# Patient Record
Sex: Female | Born: 1950 | Race: White | Hispanic: No | Smoking: Former smoker
Health system: Southern US, Community
[De-identification: ages and names within clinical notes are randomized; demographics above are authoritative.]

## PROBLEM LIST (undated history)

## (undated) DIAGNOSIS — M1990 Unspecified osteoarthritis, unspecified site: Secondary | ICD-10-CM

## (undated) DIAGNOSIS — I10 Essential (primary) hypertension: Secondary | ICD-10-CM

## (undated) DIAGNOSIS — Z87442 Personal history of urinary calculi: Secondary | ICD-10-CM

## (undated) DIAGNOSIS — J45909 Unspecified asthma, uncomplicated: Secondary | ICD-10-CM

## (undated) DIAGNOSIS — E785 Hyperlipidemia, unspecified: Secondary | ICD-10-CM

## (undated) HISTORY — DX: Essential (primary) hypertension: I10

## (undated) HISTORY — DX: Unspecified asthma, uncomplicated: J45.909

## (undated) HISTORY — DX: Unspecified osteoarthritis, unspecified site: M19.90

## (undated) HISTORY — PX: EYE SURGERY: SHX253

## (undated) HISTORY — DX: Hyperlipidemia, unspecified: E78.5

---

## 1994-12-07 HISTORY — PX: OTHER SURGICAL HISTORY: SHX169

## 2008-10-04 ENCOUNTER — Emergency Department: Payer: Self-pay | Admitting: Emergency Medicine

## 2008-11-07 ENCOUNTER — Emergency Department: Payer: Self-pay | Admitting: Unknown Physician Specialty

## 2009-07-06 ENCOUNTER — Inpatient Hospital Stay: Payer: Self-pay | Admitting: Internal Medicine

## 2009-07-23 ENCOUNTER — Emergency Department: Payer: Self-pay | Admitting: Emergency Medicine

## 2011-05-02 ENCOUNTER — Emergency Department: Payer: Self-pay | Admitting: Emergency Medicine

## 2011-09-23 ENCOUNTER — Ambulatory Visit: Payer: Self-pay | Admitting: Gastroenterology

## 2011-10-21 ENCOUNTER — Ambulatory Visit: Payer: Self-pay | Admitting: Internal Medicine

## 2012-12-07 HISTORY — PX: COLONOSCOPY: SHX174

## 2013-05-16 ENCOUNTER — Ambulatory Visit: Payer: Self-pay | Admitting: Specialist

## 2013-05-16 LAB — URINALYSIS, COMPLETE
Bacteria: NONE SEEN
Blood: NEGATIVE
Glucose,UR: NEGATIVE mg/dL (ref 0–75)
Ketone: NEGATIVE
Nitrite: NEGATIVE
Ph: 5 (ref 4.5–8.0)
Protein: NEGATIVE
Specific Gravity: 1.029 (ref 1.003–1.030)
WBC UR: 2 /HPF (ref 0–5)

## 2013-05-16 LAB — CBC
HCT: 36.7 % (ref 35.0–47.0)
HGB: 12.6 g/dL (ref 12.0–16.0)
MCH: 30.2 pg (ref 26.0–34.0)
RBC: 4.18 10*6/uL (ref 3.80–5.20)
RDW: 12.8 % (ref 11.5–14.5)

## 2013-05-16 LAB — APTT: Activated PTT: 30.5 secs (ref 23.6–35.9)

## 2013-05-23 ENCOUNTER — Inpatient Hospital Stay: Payer: Self-pay | Admitting: Specialist

## 2013-05-23 HISTORY — PX: JOINT REPLACEMENT: SHX530

## 2013-05-23 LAB — HEMOGLOBIN: HGB: 9.6 g/dL — ABNORMAL LOW (ref 12.0–16.0)

## 2013-05-24 LAB — BASIC METABOLIC PANEL
Anion Gap: 6 — ABNORMAL LOW (ref 7–16)
Calcium, Total: 8.2 mg/dL — ABNORMAL LOW (ref 8.5–10.1)
Chloride: 108 mmol/L — ABNORMAL HIGH (ref 98–107)
Creatinine: 0.85 mg/dL (ref 0.60–1.30)
EGFR (African American): >60
Osmolality: 278 (ref 275–301)
Potassium: 4.2 mmol/L (ref 3.5–5.1)
Sodium: 138 mmol/L (ref 136–145)

## 2013-05-24 LAB — HEMOGLOBIN: HGB: 9.3 g/dL — ABNORMAL LOW (ref 12.0–16.0)

## 2013-10-20 ENCOUNTER — Ambulatory Visit: Payer: Self-pay | Admitting: Nurse Practitioner

## 2013-11-20 ENCOUNTER — Encounter: Payer: Self-pay | Admitting: General Surgery

## 2013-11-20 ENCOUNTER — Ambulatory Visit (INDEPENDENT_AMBULATORY_CARE_PROVIDER_SITE_OTHER): Payer: BC Managed Care – PPO | Admitting: General Surgery

## 2013-11-20 VITALS — BP 138/80 | HR 80 | Resp 14 | Ht 62.0 in | Wt 183.0 lb

## 2013-11-20 DIAGNOSIS — R071 Chest pain on breathing: Secondary | ICD-10-CM

## 2013-11-20 DIAGNOSIS — R0789 Other chest pain: Secondary | ICD-10-CM

## 2013-11-20 NOTE — Patient Instructions (Signed)
Patient to return as needed. 

## 2013-11-20 NOTE — Progress Notes (Signed)
Patient ID: Elaine Harmon, female   DOB: 1951/06/10, 62 y.o.   MRN: [id number]  Chief Complaint  Patient presents with  . Other    lump    HPI Elaine Harmon is a 62 y.o. female who presents for a breast evaluation. The most recent mammogram was done on 10/20/13.Patient does perform regular self breast checks and gets regular mammograms done. Patient felt a lump in the middle of her breast bone. She noticed this 4 weeks ago.She states it there is no changed in size. She states she found it because the area was tender. Patient Elaine Harmon any breast problems in the past.Patient states she lost 20 pounds at the beginning of the year.She remembers she was picking up something at her job that was heavy about 5 weeks ago. This involved reaching through a narrow corridor and pulling the object towards her. 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 In the last couple of weeks the areas become less tender and slightly less prominent. No direct trauma to the area.  HPI  Past Medical History  Diagnosis Date  . Hypertension   . Hyperlipidemia   . Arthritis   . Diabetes mellitus without complication   . Asthma     Past Surgical History  Procedure Laterality Date  . Joint replacement  05/23/13    hip replacement  . Kidney tube resection  1996  . Colonoscopy  2014    Family History  Problem Relation Age of Onset  . Breast cancer Maternal Grandmother     Social History History  Substance Use Topics  . Smoking status: Former Smoker    Types: Cigarettes  . Smokeless tobacco: Never Used  . Alcohol Use: No    Allergies  Allergen Reactions  . Sulfa Antibiotics Hives    Current Outpatient Prescriptions  Medication Sig Dispense Refill  . etodolac (LODINE) 500 MG tablet Take 500 mg by mouth 2 (two) times daily.      Marland Kitchen lisinopril (PRINIVIL,ZESTRIL) 5 MG tablet Take 5 mg by mouth daily.      . metFORMIN (GLUCOPHAGE) 500 MG tablet Take 250 mg by mouth 2 (two) times daily with a meal.      .  simvastatin (ZOCOR) 10 MG tablet Take 10 mg by mouth daily.       No current facility-administered medications for this visit.    Review of Systems Review of Systems  Constitutional: Negative.   Respiratory: Negative.   Cardiovascular: Negative.     Blood pressure 138/80, pulse 80, resp. rate 14, height 5\' 2"  (1.575 m), weight 183 lb (83.008 kg).  Physical Exam Physical Exam  Constitutional: She is oriented to person, place, and time. She appears well-developed and well-nourished.  Eyes: No scleral icterus.  Cardiovascular: Normal rate, regular rhythm and normal heart sounds.   Pulmonary/Chest: Breath sounds normal. Right breast exhibits no inverted nipple, no mass, no nipple discharge, no skin change and no tenderness. Left breast exhibits no inverted nipple, no mass, no nipple discharge, no skin change and no tenderness. Breast asymmetry: right breast half cup size bigger than left.    Lymphadenopathy:    She has no cervical adenopathy.    She has no axillary adenopathy.  Neurological: She is alert and oriented to person, place, and time.  Skin: Skin is warm and dry.    Data Reviewed Bilateral mammograms dated 10/20/2013 were reviewed. BI-RAD-1.  Assessment    Musculoskeletal trauma secondary to lifting, resolving    Plan    No intervention  is required at this time. She was encouraged to call if new concerns arise.       [name] 11/21/2013, 7:08 AM

## 2013-11-21 DIAGNOSIS — R0789 Other chest pain: Secondary | ICD-10-CM | POA: Insufficient documentation

## 2014-10-08 ENCOUNTER — Encounter: Payer: Self-pay | Admitting: General Surgery

## 2015-03-29 NOTE — Op Note (Signed)
PATIENT NAME:  Elaine GarinCALES, Bertrice E MR#:  [id number] DATE OF BIRTH:  [birth year]  DATE OF PROCEDURE:  05/23/2013  PREOPERATIVE DIAGNOSIS: Severe degenerative arthritis, right hip.   POSTOPERATIVE DIAGNOSIS: Severe degenerative arthritis, right hip.   PROCEDURE: Right total hip arthroplasty.   SURGEON: Myra Rudehristopher Alicianna Litchford, M.D.   ASSISTANT: Deeann SaintHoward Miller, M.D.   ANESTHESIA: Spinal.   COMPLICATIONS: None.   ESTIMATED BLOOD LOSS: 300 mL.   DRAINS: Two Autovac.   DESCRIPTION OF PROCEDURE: 2 grams of Ancef was given intravenously prior to the procedure. Spinal anesthesia is induced. The patient is placed in the left lateral decubitus position for right hip surgery. The right hip is thoroughly prepped with alcohol and ChloraPrep and draped in standard sterile fashion. A standard posterolateral incision is made and the dissection carried down to the fascia lata which is excised in the line of the skin incision. Charnley retractor is placed. The sciatic nerve is palpated deep within the wound and protected throughout the case. Piriformis tendon is identified and cut off of the femur and tagged. The remainder of the external rotator muscles are cut with the coagulation Bovie. Periosteal elevator is used to completely define the posterior capsule. This is incised in a T fashion and the ends are tagged and saved. The hip is then easily dislocated and the femoral head cut off at the appropriate neck angle and length. The retractors are placed and labrum debridement and any other soft tissue within the acetabulum is removed. The acetabulum is then are reamed up to a 51 mm in diameter reamer and the 52 porous-coated trispiked acetabulum is impacted into place and is seen to be a secure fit. The hole eliminator and then the polyethylene liner with the 4 mm build-up is placed superiorly and somewhat posteriorly. Proximal femur is then reamed in standard fashion up to a 13 mm in diameter and the 13.5 broach, narrow  component, is used to broach the proximal femur.  A -2 hip ball was then used and the hip is reduced and is seen to be stable in all directions. Femoral trial components are then removed. The wound is thoroughly irrigated multiple times. The porous-coated AML 13.5 narrow prosthesis is then impacted into place and is seen to be an excellent fit. The -2 mm head is impacted into place and the hip is reduced and is seen to be satisfactory on range of motion. The wound is thoroughly irrigated multiple times. Two Autovac drains were brought out through separate stab wound incisions. Fascia lata is closed with 0 Ethibond. Subcutaneous tissue is closed with 2-0 Vicryl and the skin is closed with the skin stapler. A soft bulky dressing is applied. The patient is then carefully turned over into the hospital bed and secured with the abduction pillow. She is returned to the recovery room in satisfactory condition having tolerated the procedure quite well.    ____________________________ Clare Gandyhristopher E. Annesha Delgreco, MD ces:rw D: 05/23/2013 11:32:51 ET T: 05/23/2013 12:23:07 ET JOB#: [id number]  cc: Clare Gandyhristopher E. Cylas Falzone, MD, <Dictator> Clare GandyHRISTOPHER E Vonette Grosso MD ELECTRONICALLY SIGNED 05/24/2013 6:15

## 2015-03-29 NOTE — Discharge Summary (Signed)
PATIENT NAME:  Elaine Harmon, Elaine Harmon MR#:  [id number] DATE OF BIRTH:  May 11, 1951  DATE OF ADMISSION:  05/23/2013 DATE OF DISCHARGE:  05/26/2013  DISCHARGE DIAGNOSES 1.  Severe degenerative arthritis right hip.  2.  Hypertension.  3.  Glaucoma.  4.  History of kidney stones.  5.  Hyperlipidemia.  6.  Asthma.  7.  Type 2 diabetes mellitus.   OPERATIONS OR PROCEDURES PERFORMED: On 05/23/2013 right total hip arthroplasty with DePuy AML components.   HISTORY AND PHYSICAL EXAMINATION: As written on admission.   LABORATORY DATA: As noted in the chart.   COURSE IN THE HOSPITAL: Preoperative medical clearance had been obtained prior to surgery. The patient was taken to the Operating Room on 05/23/2013, where right total hip replacement was performed without any particular problems. Postoperatively, the patient did extremely well. Her Hemovac was removed on day #1.  Her hemoglobin was seen to be satisfactory postoperatively. She was progressed in physical therapy with partial weight-bearing. Her wound was seen to be healing in well.   DISCHARGE INSTRUCTIONS:  She was discharged on 05/26/2013 on her home medications and, in addition, aspirin 325 mg twice a day after meals and oxycodone 10 mg every 3 hours as necessary for pain. She is to have home health physical therapy which was ordered. She is to return to the office to see Dr. Katrinka BlazingSmith in approximately 2 weeks for x-ray and probable staple removal.   ____________________________ Clare Gandyhristopher Harmon. Aemon Koeller, MD ces:cs D: 06/13/2013 14:19:20 ET T: 06/13/2013 14:35:26 ET JOB#: [id number]  cc: Clare Gandyhristopher Harmon. Kennen Stammer, MD, <Dictator> Clare GandyHRISTOPHER Harmon Sherese Heyward MD ELECTRONICALLY SIGNED 06/13/2013 19:31

## 2016-08-12 ENCOUNTER — Other Ambulatory Visit: Payer: Self-pay | Admitting: Nurse Practitioner

## 2016-08-12 DIAGNOSIS — Z1231 Encounter for screening mammogram for malignant neoplasm of breast: Secondary | ICD-10-CM

## 2016-09-08 ENCOUNTER — Ambulatory Visit: Payer: Self-pay | Attending: Nurse Practitioner

## 2017-02-05 ENCOUNTER — Other Ambulatory Visit: Payer: Self-pay | Admitting: Physician Assistant

## 2017-02-05 DIAGNOSIS — M25519 Pain in unspecified shoulder: Secondary | ICD-10-CM

## 2017-02-17 ENCOUNTER — Other Ambulatory Visit: Payer: Self-pay | Admitting: Physician Assistant

## 2017-02-17 DIAGNOSIS — M25519 Pain in unspecified shoulder: Secondary | ICD-10-CM

## 2017-03-01 ENCOUNTER — Ambulatory Visit
Admission: RE | Admit: 2017-03-01 | Discharge: 2017-03-01 | Disposition: A | Discharge status: Home / Self Care | Payer: BLUE CROSS/BLUE SHIELD | Source: Ambulatory Visit | Attending: Physician Assistant | Admitting: Physician Assistant

## 2017-03-01 DIAGNOSIS — M62512 Muscle wasting and atrophy, not elsewhere classified, left shoulder: Secondary | ICD-10-CM | POA: Diagnosis not present

## 2017-03-01 DIAGNOSIS — M25712 Osteophyte, left shoulder: Secondary | ICD-10-CM | POA: Diagnosis not present

## 2017-03-01 DIAGNOSIS — M19012 Primary osteoarthritis, left shoulder: Secondary | ICD-10-CM | POA: Diagnosis not present

## 2017-03-01 DIAGNOSIS — M24012 Loose body in left shoulder: Secondary | ICD-10-CM | POA: Insufficient documentation

## 2017-03-01 DIAGNOSIS — M25512 Pain in left shoulder: Secondary | ICD-10-CM | POA: Diagnosis present

## 2017-03-01 DIAGNOSIS — M25519 Pain in unspecified shoulder: Secondary | ICD-10-CM

## 2017-04-19 ENCOUNTER — Encounter: Payer: Self-pay | Admitting: *Deleted

## 2017-04-22 ENCOUNTER — Ambulatory Visit: Payer: BLUE CROSS/BLUE SHIELD | Admitting: Anesthesiology

## 2017-04-22 ENCOUNTER — Encounter
Admission: RE | Disposition: A | Discharge status: Home / Self Care | Payer: Self-pay | Source: Ambulatory Visit | Attending: Ophthalmology

## 2017-04-22 ENCOUNTER — Encounter: Payer: Self-pay | Admitting: *Deleted

## 2017-04-22 ENCOUNTER — Ambulatory Visit
Admission: RE | Admit: 2017-04-22 | Discharge: 2017-04-22 | Disposition: A | Discharge status: Home / Self Care | Payer: BLUE CROSS/BLUE SHIELD | Source: Ambulatory Visit | Attending: Ophthalmology | Admitting: Ophthalmology

## 2017-04-22 DIAGNOSIS — H401112 Primary open-angle glaucoma, right eye, moderate stage: Secondary | ICD-10-CM | POA: Diagnosis not present

## 2017-04-22 DIAGNOSIS — I10 Essential (primary) hypertension: Secondary | ICD-10-CM | POA: Insufficient documentation

## 2017-04-22 DIAGNOSIS — Z87891 Personal history of nicotine dependence: Secondary | ICD-10-CM | POA: Insufficient documentation

## 2017-04-22 DIAGNOSIS — E1136 Type 2 diabetes mellitus with diabetic cataract: Secondary | ICD-10-CM | POA: Diagnosis not present

## 2017-04-22 DIAGNOSIS — Z79899 Other long term (current) drug therapy: Secondary | ICD-10-CM | POA: Diagnosis not present

## 2017-04-22 DIAGNOSIS — J45909 Unspecified asthma, uncomplicated: Secondary | ICD-10-CM | POA: Insufficient documentation

## 2017-04-22 HISTORY — PX: CATARACT EXTRACTION W/PHACO: SHX586

## 2017-04-22 HISTORY — DX: Personal history of urinary calculi: Z87.442

## 2017-04-22 LAB — GLUCOSE, CAPILLARY: GLUCOSE-CAPILLARY: 111 mg/dL — AB (ref 65–99)

## 2017-04-22 SURGERY — PHACOEMULSIFICATION, CATARACT, WITH IOL INSERTION
Anesthesia: Monitor Anesthesia Care | Site: Eye | Laterality: Right | Wound class: Clean

## 2017-04-22 MED ORDER — SODIUM CHLORIDE 0.9 % IV SOLN
INTRAVENOUS | Status: DC
  Administered 2017-04-22: 08:00:00 via INTRAVENOUS

## 2017-04-22 MED ORDER — MOXIFLOXACIN HCL 0.5 % OP SOLN: 1.0000 [drp] | OPHTHALMIC | Status: DC | PRN

## 2017-04-22 MED ORDER — MOXIFLOXACIN HCL 0.5 % OP SOLN
OPHTHALMIC | Status: AC
  Filled 2017-04-22: qty 3

## 2017-04-22 MED ORDER — SODIUM HYALURONATE 23 MG/ML IO SOLN
INTRAOCULAR | Status: AC
  Filled 2017-04-22: qty 0.6

## 2017-04-22 MED ORDER — MIDAZOLAM HCL 2 MG/2ML IJ SOLN
INTRAMUSCULAR | Status: AC
  Filled 2017-04-22: qty 2

## 2017-04-22 MED ORDER — POVIDONE-IODINE 5 % OP SOLN
OPHTHALMIC | Status: DC | PRN
  Administered 2017-04-22: 1 via OPHTHALMIC

## 2017-04-22 MED ORDER — CARBACHOL 0.01 % IO SOLN
INTRAOCULAR | Status: DC | PRN
  Administered 2017-04-22: 0.5 mL via INTRAOCULAR

## 2017-04-22 MED ORDER — LIDOCAINE HCL (PF) 2 % IJ SOLN
INTRAMUSCULAR | Status: AC
  Filled 2017-04-22: qty 2

## 2017-04-22 MED ORDER — HYALURONIDASE HUMAN 150 UNIT/ML IJ SOLN
INTRAMUSCULAR | Status: AC
  Filled 2017-04-22: qty 1

## 2017-04-22 MED ORDER — ONDANSETRON HCL 4 MG/2ML IJ SOLN
INTRAMUSCULAR | Status: DC | PRN
  Administered 2017-04-22: 4 mg via INTRAVENOUS

## 2017-04-22 MED ORDER — MIDAZOLAM HCL 2 MG/2ML IJ SOLN
INTRAMUSCULAR | Status: DC | PRN
  Administered 2017-04-22: 1 mg via INTRAVENOUS

## 2017-04-22 MED ORDER — EPINEPHRINE PF 1 MG/ML IJ SOLN
INTRAMUSCULAR | Status: AC
Start: 2017-04-22 — End: ?
  Filled 2017-04-22: qty 1

## 2017-04-22 MED ORDER — POVIDONE-IODINE 5 % OP SOLN
OPHTHALMIC | Status: AC
  Filled 2017-04-22: qty 30

## 2017-04-22 MED ORDER — NEOMYCIN-POLYMYXIN-DEXAMETH 3.5-10000-0.1 OP OINT
TOPICAL_OINTMENT | OPHTHALMIC | Status: AC
  Filled 2017-04-22: qty 3.5

## 2017-04-22 MED ORDER — EPINEPHRINE PF 1 MG/ML IJ SOLN
INTRAMUSCULAR | Status: AC
  Filled 2017-04-22: qty 2

## 2017-04-22 MED ORDER — NEOMYCIN-POLYMYXIN-DEXAMETH 3.5-10000-0.1 OP OINT
TOPICAL_OINTMENT | OPHTHALMIC | Status: DC | PRN
  Administered 2017-04-22: 1 via OPHTHALMIC

## 2017-04-22 MED ORDER — ALFENTANIL 500 MCG/ML IJ INJ
INJECTION | INTRAVENOUS | Status: DC | PRN
  Administered 2017-04-22: 500 ug via INTRAVENOUS
  Administered 2017-04-22: 250 ug via INTRAVENOUS

## 2017-04-22 MED ORDER — SODIUM HYALURONATE 10 MG/ML IO SOLN
INTRAOCULAR | Status: DC | PRN
  Administered 2017-04-22: 0.55 mL via INTRAOCULAR

## 2017-04-22 MED ORDER — MOXIFLOXACIN HCL 0.5 % OP SOLN
OPHTHALMIC | Status: DC | PRN
  Administered 2017-04-22: 0.2 mL via OPHTHALMIC

## 2017-04-22 MED ORDER — BUPIVACAINE HCL (PF) 0.75 % IJ SOLN
INTRAMUSCULAR | Status: AC
  Filled 2017-04-22: qty 10

## 2017-04-22 MED ORDER — FENTANYL CITRATE (PF) 100 MCG/2ML IJ SOLN
INTRAMUSCULAR | Status: AC
  Filled 2017-04-22: qty 2

## 2017-04-22 MED ORDER — ARMC OPHTHALMIC DILATING DROPS
1.0000 "application " | OPHTHALMIC | Status: DC
  Administered 2017-04-22 (×3): 1 via OPHTHALMIC

## 2017-04-22 MED ORDER — LIDOCAINE HCL (PF) 4 % IJ SOLN
INTRAMUSCULAR | Status: DC | PRN
  Administered 2017-04-22: 4 mL via OPHTHALMIC

## 2017-04-22 MED ORDER — LIDOCAINE HCL (PF) 4 % IJ SOLN
INTRAMUSCULAR | Status: DC | PRN
  Administered 2017-04-22: 9 mL via OPHTHALMIC

## 2017-04-22 MED ORDER — SODIUM HYALURONATE 10 MG/ML IO SOLN
INTRAOCULAR | Status: AC
  Filled 2017-04-22: qty 0.85

## 2017-04-22 MED ORDER — SODIUM HYALURONATE 23 MG/ML IO SOLN
INTRAOCULAR | Status: DC | PRN
  Administered 2017-04-22: 0.6 mL via INTRAOCULAR

## 2017-04-22 MED ORDER — ARMC OPHTHALMIC DILATING DROPS
OPHTHALMIC | Status: AC
  Administered 2017-04-22: 1 via OPHTHALMIC
  Filled 2017-04-22: qty 0.4

## 2017-04-22 MED ORDER — ONDANSETRON HCL 4 MG/2ML IJ SOLN
INTRAMUSCULAR | Status: AC
Start: 2017-04-22 — End: ?
  Filled 2017-04-22: qty 2

## 2017-04-22 MED ORDER — EPINEPHRINE PF 1 MG/ML IJ SOLN
INTRAOCULAR | Status: DC | PRN
  Administered 2017-04-22: 09:00:00 via OPHTHALMIC

## 2017-04-22 SURGICAL SUPPLY — 17 items
DISSECTOR HYDRO NUCLEUS 50X22 (MISCELLANEOUS) ×3 IMPLANT
GLOVE BIO SURGEON STRL SZ8 (GLOVE) ×3 IMPLANT
GLOVE BIOGEL M 6.5 STRL (GLOVE) ×3 IMPLANT
GLOVE SURG LX 7.5 STRW (GLOVE) ×2
GLOVE SURG LX STRL 7.5 STRW (GLOVE) ×1 IMPLANT
GOWN STRL REUS W/ TWL LRG LVL3 (GOWN DISPOSABLE) ×2 IMPLANT
GOWN STRL REUS W/TWL LRG LVL3 (GOWN DISPOSABLE) ×4
ISTENT OPTHAL 1X.33 ADULT LF (Permanent Stent) ×3 IMPLANT
LENS IOL TECNIS ITEC 17.0 (Intraocular Lens) ×3 IMPLANT
PACK CATARACT (MISCELLANEOUS) ×3 IMPLANT
PACK CATARACT KING (MISCELLANEOUS) ×2 IMPLANT
PACK EYE AFTER SURG (MISCELLANEOUS) ×3 IMPLANT
SOL BSS BAG (MISCELLANEOUS) ×3
SOLUTION BSS BAG (MISCELLANEOUS) ×1 IMPLANT
STENT 'ISTENT OPTH 1X.33 ADLT (Permanent Stent) ×1 IMPLANT
WATER STERILE IRR 250ML POUR (IV SOLUTION) ×3 IMPLANT
WIPE NON LINTING 3.25X3.25 (MISCELLANEOUS) ×3 IMPLANT

## 2017-04-22 NOTE — Discharge Instructions (Signed)
Eye Surgery Discharge Instructions  Expect mild scratchy sensation or mild soreness. DO NOT RUB YOUR EYE!  The day of surgery:  Minimal physical activity, but bed rest is not required  No reading, computer work, or close hand work  No bending, lifting, or straining.  May watch TV  For 24 hours:  No driving, legal decisions, or alcoholic beverages  Safety precautions  Eat anything you prefer: It is better to start with liquids, then soup then solid foods.  _____ Eye patch should be worn until postoperative exam tomorrow.  ____ Solar shield eyeglasses should be worn for comfort in the sunlight/patch while sleeping  Resume all regular medications including aspirin or Coumadin if these were discontinued prior to surgery. You may shower, bathe, shave, or wash your hair. Tylenol may be taken for mild discomfort.  Call your doctor if you experience significant pain, nausea, or vomiting, fever > 101 or other signs of infection. [phone] or [phone] Specific instructions:  Follow-up Information    [place] CraneKing, Bradley Mark, MD Follow up.   Specialty:  Ophthalmology Why:  04-23-17 at9:30 Contact information: [address] [place] [address] [phone]          Eye Surgery Discharge Instructions  Expect mild scratchy sensation or mild soreness. DO NOT RUB YOUR EYE!  The day of surgery:  Minimal physical activity, but bed rest is not required  No reading, computer work, or close hand work  No bending, lifting, or straining.  May watch TV  For 24 hours:  No driving, legal decisions, or alcoholic beverages  Safety precautions  Eat anything you prefer: It is better to start with liquids, then soup then solid foods.  _____ Eye patch should be worn until postoperative exam tomorrow.  ____ Solar shield eyeglasses should be worn for comfort in the sunlight/patch while sleeping  Resume all regular medications including aspirin or Coumadin if these were  discontinued prior to surgery. You may shower, bathe, shave, or wash your hair. Tylenol may be taken for mild discomfort.  Call your doctor if you experience significant pain, nausea, or vomiting, fever > 101 or other signs of infection. [phone] or [phone] Specific instructions:  Follow-up Information    [place] CraneKing, Bradley Mark, MD Follow up.   Specialty:  Ophthalmology Why:  04-23-17 at9:30 Contact information: [address] [place] [address] [phone]

## 2017-04-22 NOTE — Transfer of Care (Signed)
Immediate Anesthesia Transfer of Care Note  Patient: Elaine Harmon  Procedure(s) Performed: Procedure(s) with comments: CATARACT EXTRACTION PHACO AND INTRAOCULAR LENS PLACEMENT (IOC) INSERTION OF I-STENT (Right) - [place] 1:35.5 AP% 17.2 CDE 17.29 FLUID PACK LOT# [id number]  Patient Location: PACU  Anesthesia Type:MAC  Level of Consciousness: awake  Airway & Oxygen Therapy: Patient Spontanous Breathing and Patient connected to nasal cannula oxygen  Post-op Assessment: Report given to RN and Post -op Vital signs reviewed and stable  Post vital signs: Reviewed and stable  Last Vitals:  Vitals:   04/22/17 0739  BP: 130/68  Pulse: 71  Resp: 16  Temp: 36.8 C    Last Pain:  Vitals:   04/22/17 0739  TempSrc: Oral         Complications: No apparent anesthesia complications

## 2017-04-22 NOTE — H&P (Signed)
The History and Physical notes are on paper, have been signed, and are to be scanned.   I have examined the patient and there are no changes to the H&P.   Willey BladeBradley King 04/22/2017 8:43 AM

## 2017-04-22 NOTE — Anesthesia Preprocedure Evaluation (Signed)
Anesthesia Evaluation  Patient identified by MRN, date of birth, ID band Patient awake    Reviewed: Allergy & Precautions, NPO status , Patient's Chart, lab work & pertinent test results  Airway Mallampati: III       Dental   Pulmonary asthma , former smoker,           Cardiovascular hypertension, Pt. on medications      Neuro/Psych negative neurological ROS     GI/Hepatic negative GI ROS, Neg liver ROS,   Endo/Other  diabetes, Type 2  Renal/GU      Musculoskeletal   Abdominal   Peds  Hematology negative hematology ROS (+)   Anesthesia Other Findings   Reproductive/Obstetrics                             Anesthesia Physical Anesthesia Plan  ASA: II  Anesthesia Plan: MAC   Post-op Pain Management:    Induction: Intravenous  Airway Management Planned: Nasal Cannula  Additional Equipment:   Intra-op Plan:   Post-operative Plan:   Informed Consent: I have reviewed the patients History and Physical, chart, labs and discussed the procedure including the risks, benefits and alternatives for the proposed anesthesia with the patient or authorized representative who has indicated his/her understanding and acceptance.     Plan Discussed with:   Anesthesia Plan Comments:         Anesthesia Quick Evaluation

## 2017-04-22 NOTE — Anesthesia Post-op Follow-up Note (Cosign Needed)
Anesthesia QCDR form completed.        

## 2017-04-22 NOTE — Op Note (Signed)
OPERATIVE NOTE  Elaine Harmon [id number] 04/22/2017   PREOPERATIVE DIAGNOSIS:   1.  Moderate open angle glaucoma, right eye. F81.8299 2.  Nuclear sclerotic cataract right eye.  H25.11   POSTOPERATIVE DIAGNOSIS:    same.   PROCEDURE:   1.  Placement of trabecular bypass stent (istent). CPT 0191T 2.  Phacoemusification with posterior chamber intraocular lens placement of the right eye  CPT [phone]   LENS:   Implant Name Type Inv. Item Serial No. Manufacturer Lot No. LRB No. Used  LENS IOL DIOP 17.0 - [id number] Intraocular Lens LENS IOL DIOP 17.0 [id number] AMO   Right 1   Other implanted device:  [employer] Istent model [id number] SN #[id number]      PCB00 +17.0   ULTRASOUND TIME: 1 minutes 35 seconds.  CDE 17.29   SURGEON:  Benay Pillow, MD, MPH  ANESTHESIOLOGIST: Anesthesiologist: Gunnar Fusi, MD CRNA: Nelda Marseille, CRNA; Allean Found, CRNA   ANESTHESIA:  MAC with retrobulbar block and intracameral preservative-free intracameral lidocaine.  ESTIMATED BLOOD LOSS: less than 1 mL.   COMPLICATIONS:  None.   DESCRIPTION OF PROCEDURE:  The patient was identified in the holding room and transported to the operating room. A retrobulbar block was performed with 3 cc.  and placed in the supine position under the operating microscope.  The right eye was identified as the operative eye and it was prepped and draped in the usual sterile ophthalmic fashion.   A 1.0 millimeter clear-corneal paracentesis was made at the 10:30 position. 0.5 ml of preservative-free 1% lidocaine with epinephrine was injected into the anterior chamber.  The anterior chamber was filled with Healon 5 viscoelastic.  A 2.4 millimeter keratome was used to make a near-clear corneal incision at the 8:00 position.   Attention was turned to the istent.  The patients head was turned to the left and the microscope was tilted to 035 degrees. Ocular instruments/Glaukos OAL/H2 gonioprism was used with IPC05  (iclip) coupled with Healon 5 on the cornea was used to visualize the trabecular meshwork. The istent was opened and introduced into the eye.  The meshwork was engaged with the tip of the iStent and then using the inserter, the iStent slid down Schlemm's canal until the stent was well seated.  The stent was then released from the inserter.  The iStent was inspected and in good position.  Next, attention was turned to the phacoemulsification A curvilinear capsulorrhexis was made with a cystotome and capsulorrhexis forceps.  Balanced salt solution was used to hydrodissect and hydrodelineate the nucleus.   Phacoemulsification was then used in stop and chop fashion to remove the lens nucleus and epinucleus. There was moderate chemosis which occasionally interefered with the superior view.  The remaining cortex was then removed using the irrigation and aspiration handpiece. Healon was then placed into the capsular bag to distend it for lens placement.  A lens was then injected into the capsular bag.  The remaining viscoelastic was aspirated.   Wounds were hydrated with balanced salt solution.  The anterior chamber was inflated to a physiologic pressure with balanced salt solution.    Intracameral vigamox 0.1 mL undiluted was injected into the eye and a drop placed onto the ocular surface.  No wound leaks were noted.  Maxitrol ointment, a patch and shield were placed.  The patient was taken to the recovery room in stable condition without complications of anesthesia or surgery  Benay Pillow 04/22/2017, 9:55 AM

## 2017-04-22 NOTE — Progress Notes (Signed)
Shield intact and Dr. Brooke DareKing into see

## 2017-04-22 NOTE — Anesthesia Postprocedure Evaluation (Signed)
Anesthesia Post Note  Patient: Elaine Harmon  Procedure(s) Performed: Procedure(s) (LRB): CATARACT EXTRACTION PHACO AND INTRAOCULAR LENS PLACEMENT (IOC) INSERTION OF I-STENT (Right)  Patient location during evaluation: Short Stay Anesthesia Type: MAC Level of consciousness: awake Pain management: pain level controlled Vital Signs Assessment: post-procedure vital signs reviewed and stable Respiratory status: spontaneous breathing Cardiovascular status: blood pressure returned to baseline Postop Assessment: no headache Anesthetic complications: no     Last Vitals:  Vitals:   04/22/17 0739  BP: 130/68  Pulse: 71  Resp: 16  Temp: 36.8 C    Last Pain:  Vitals:   04/22/17 0739  TempSrc: Oral                 Buckner Malta

## 2017-04-22 NOTE — Anesthesia Procedure Notes (Signed)
Date/Time: 04/22/2017 8:55 AM Performed by: Henrietta HooverPOPE, Janessa Mickle Pre-anesthesia Checklist: Patient identified, Emergency Drugs available, Suction available, Patient being monitored and Timeout performed Patient Re-evaluated:Patient Re-evaluated prior to inductionOxygen Delivery Method: Nasal cannula Placement Confirmation: positive ETCO2

## 2017-04-29 ENCOUNTER — Encounter: Payer: Self-pay | Admitting: *Deleted

## 2017-05-06 ENCOUNTER — Encounter: Payer: Self-pay | Admitting: *Deleted

## 2017-05-06 ENCOUNTER — Encounter
Admission: RE | Disposition: A | Discharge status: Home / Self Care | Payer: Self-pay | Source: Ambulatory Visit | Attending: Ophthalmology

## 2017-05-06 ENCOUNTER — Ambulatory Visit
Admission: RE | Admit: 2017-05-06 | Discharge: 2017-05-06 | Disposition: A | Discharge status: Home / Self Care | Payer: BLUE CROSS/BLUE SHIELD | Source: Ambulatory Visit | Attending: Ophthalmology | Admitting: Ophthalmology

## 2017-05-06 ENCOUNTER — Ambulatory Visit: Payer: BLUE CROSS/BLUE SHIELD | Admitting: Anesthesiology

## 2017-05-06 DIAGNOSIS — Z882 Allergy status to sulfonamides status: Secondary | ICD-10-CM | POA: Diagnosis not present

## 2017-05-06 DIAGNOSIS — Z96611 Presence of right artificial shoulder joint: Secondary | ICD-10-CM | POA: Insufficient documentation

## 2017-05-06 DIAGNOSIS — Z79899 Other long term (current) drug therapy: Secondary | ICD-10-CM | POA: Diagnosis not present

## 2017-05-06 DIAGNOSIS — Z87891 Personal history of nicotine dependence: Secondary | ICD-10-CM | POA: Insufficient documentation

## 2017-05-06 DIAGNOSIS — Z87442 Personal history of urinary calculi: Secondary | ICD-10-CM | POA: Insufficient documentation

## 2017-05-06 DIAGNOSIS — J45909 Unspecified asthma, uncomplicated: Secondary | ICD-10-CM | POA: Diagnosis not present

## 2017-05-06 DIAGNOSIS — H4010X2 Unspecified open-angle glaucoma, moderate stage: Secondary | ICD-10-CM | POA: Diagnosis not present

## 2017-05-06 DIAGNOSIS — Z96641 Presence of right artificial hip joint: Secondary | ICD-10-CM | POA: Insufficient documentation

## 2017-05-06 DIAGNOSIS — H2512 Age-related nuclear cataract, left eye: Secondary | ICD-10-CM | POA: Insufficient documentation

## 2017-05-06 DIAGNOSIS — E1136 Type 2 diabetes mellitus with diabetic cataract: Secondary | ICD-10-CM | POA: Insufficient documentation

## 2017-05-06 DIAGNOSIS — I10 Essential (primary) hypertension: Secondary | ICD-10-CM | POA: Insufficient documentation

## 2017-05-06 DIAGNOSIS — M1990 Unspecified osteoarthritis, unspecified site: Secondary | ICD-10-CM | POA: Diagnosis not present

## 2017-05-06 HISTORY — PX: CATARACT EXTRACTION W/PHACO: SHX586

## 2017-05-06 SURGERY — PHACOEMULSIFICATION, CATARACT, WITH IOL INSERTION
Anesthesia: Monitor Anesthesia Care | Site: Eye | Laterality: Left | Wound class: Clean

## 2017-05-06 MED ORDER — BUPIVACAINE HCL (PF) 0.75 % IJ SOLN
INTRAMUSCULAR | Status: AC
  Filled 2017-05-06: qty 10

## 2017-05-06 MED ORDER — FENTANYL CITRATE (PF) 100 MCG/2ML IJ SOLN: 25.0000 ug | INTRAMUSCULAR | Status: DC | PRN

## 2017-05-06 MED ORDER — POVIDONE-IODINE 5 % OP SOLN
OPHTHALMIC | Status: AC
  Filled 2017-05-06: qty 30

## 2017-05-06 MED ORDER — LIDOCAINE HCL (PF) 2 % IJ SOLN
INTRAMUSCULAR | Status: AC
  Filled 2017-05-06: qty 4

## 2017-05-06 MED ORDER — SODIUM HYALURONATE 10 MG/ML IO SOLN
INTRAOCULAR | Status: DC | PRN
  Administered 2017-05-06: 0.55 mL via INTRAOCULAR

## 2017-05-06 MED ORDER — CARBACHOL 0.01 % IO SOLN
INTRAOCULAR | Status: DC | PRN
  Administered 2017-05-06: 0.5 mL via INTRAOCULAR

## 2017-05-06 MED ORDER — LIDOCAINE HCL (PF) 4 % IJ SOLN
INTRAMUSCULAR | Status: DC | PRN
  Administered 2017-05-06: 4 mL via OPHTHALMIC

## 2017-05-06 MED ORDER — BUPIVACAINE HCL (PF) 0.75 % IJ SOLN
INTRAMUSCULAR | Status: DC | PRN
  Administered 2017-05-06: 4 mL via OPHTHALMIC

## 2017-05-06 MED ORDER — ALFENTANIL 500 MCG/ML IJ INJ
INJECTION | INTRAVENOUS | Status: DC | PRN
  Administered 2017-05-06: 500 ug via INTRAVENOUS

## 2017-05-06 MED ORDER — BSS IO SOLN
INTRAOCULAR | Status: DC | PRN
  Administered 2017-05-06: 08:00:00 via OPHTHALMIC

## 2017-05-06 MED ORDER — SODIUM HYALURONATE 10 MG/ML IO SOLN
INTRAOCULAR | Status: AC
  Filled 2017-05-06: qty 0.85

## 2017-05-06 MED ORDER — MOXIFLOXACIN HCL 0.5 % OP SOLN
OPHTHALMIC | Status: AC
  Filled 2017-05-06: qty 3

## 2017-05-06 MED ORDER — BACITRACIN-NEOMYCIN-POLYMYXIN 400-5-5000 EX OINT
TOPICAL_OINTMENT | CUTANEOUS | Status: AC
  Filled 2017-05-06: qty 1

## 2017-05-06 MED ORDER — SODIUM HYALURONATE 23 MG/ML IO SOLN
INTRAOCULAR | Status: AC
  Filled 2017-05-06: qty 0.6

## 2017-05-06 MED ORDER — EPINEPHRINE PF 1 MG/ML IJ SOLN
INTRAMUSCULAR | Status: AC
  Filled 2017-05-06: qty 2

## 2017-05-06 MED ORDER — MIDAZOLAM HCL 2 MG/2ML IJ SOLN
INTRAMUSCULAR | Status: AC
  Filled 2017-05-06: qty 2

## 2017-05-06 MED ORDER — MOXIFLOXACIN HCL 0.5 % OP SOLN: 1.0000 [drp] | OPHTHALMIC | Status: DC | PRN

## 2017-05-06 MED ORDER — FENTANYL CITRATE (PF) 100 MCG/2ML IJ SOLN
INTRAMUSCULAR | Status: AC
Start: 2017-05-06 — End: 2017-05-06
  Filled 2017-05-06: qty 2

## 2017-05-06 MED ORDER — ARMC OPHTHALMIC DILATING DROPS
1.0000 "application " | OPHTHALMIC | Status: AC
  Administered 2017-05-06 (×3): 1 via OPHTHALMIC

## 2017-05-06 MED ORDER — HYALURONIDASE HUMAN 150 UNIT/ML IJ SOLN
INTRAMUSCULAR | Status: AC
  Filled 2017-05-06: qty 1

## 2017-05-06 MED ORDER — POVIDONE-IODINE 5 % OP SOLN
OPHTHALMIC | Status: DC | PRN
  Administered 2017-05-06: 1 via OPHTHALMIC

## 2017-05-06 MED ORDER — ONDANSETRON HCL 4 MG/2ML IJ SOLN: 4.0000 mg | Freq: Once | INTRAMUSCULAR | Status: DC | PRN

## 2017-05-06 MED ORDER — MOXIFLOXACIN HCL 0.5 % OP SOLN
OPHTHALMIC | Status: DC | PRN
  Administered 2017-05-06: 0.2 mL via OPHTHALMIC

## 2017-05-06 MED ORDER — NEOMYCIN-POLYMYXIN-DEXAMETH 3.5-10000-0.1 OP OINT
TOPICAL_OINTMENT | OPHTHALMIC | Status: DC | PRN
  Administered 2017-05-06: 1 via OPHTHALMIC

## 2017-05-06 MED ORDER — SODIUM CHLORIDE 0.9 % IV SOLN
INTRAVENOUS | Status: DC
  Administered 2017-05-06: 07:00:00 via INTRAVENOUS

## 2017-05-06 MED ORDER — ARMC OPHTHALMIC DILATING DROPS
OPHTHALMIC | Status: AC
  Filled 2017-05-06: qty 0.4

## 2017-05-06 MED ORDER — MIDAZOLAM HCL 2 MG/2ML IJ SOLN
INTRAMUSCULAR | Status: DC | PRN
  Administered 2017-05-06: 1 mg via INTRAVENOUS

## 2017-05-06 MED ORDER — ALFENTANIL 500 MCG/ML IJ INJ
INJECTION | INTRAVENOUS | Status: AC
Start: 2017-05-06 — End: 2017-05-06
  Filled 2017-05-06: qty 5

## 2017-05-06 MED ORDER — SODIUM HYALURONATE 23 MG/ML IO SOLN
INTRAOCULAR | Status: DC | PRN
  Administered 2017-05-06: 0.6 mL via INTRAOCULAR

## 2017-05-06 SURGICAL SUPPLY — 18 items
DISSECTOR HYDRO NUCLEUS 50X22 (MISCELLANEOUS) ×3 IMPLANT
GLOVE BIO SURGEON STRL SZ8 (GLOVE) ×3 IMPLANT
GLOVE BIOGEL M 6.5 STRL (GLOVE) ×3 IMPLANT
GLOVE SURG LX 7.5 STRW (GLOVE) ×2
GLOVE SURG LX STRL 7.5 STRW (GLOVE) ×1 IMPLANT
GOWN STRL REUS W/ TWL LRG LVL3 (GOWN DISPOSABLE) ×2 IMPLANT
GOWN STRL REUS W/TWL LRG LVL3 (GOWN DISPOSABLE) ×4
ICLIP (OPHTHALMIC RELATED) ×2 IMPLANT
ISTENT OPTHAL 1X.33 ADULT LF (Permanent Stent) ×2 IMPLANT
LENS IOL TECNIS ITEC 21.0 (Intraocular Lens) ×3 IMPLANT
PACK CATARACT (MISCELLANEOUS) ×3 IMPLANT
PACK CATARACT KING (MISCELLANEOUS) ×3 IMPLANT
PACK EYE AFTER SURG (MISCELLANEOUS) ×3 IMPLANT
SOL BSS BAG (MISCELLANEOUS) ×3
SOLUTION BSS BAG (MISCELLANEOUS) ×1 IMPLANT
STENT 'ISTENT OPTH 1X.33 ADLT (Permanent Stent) ×1 IMPLANT
WATER STERILE IRR 250ML POUR (IV SOLUTION) ×3 IMPLANT
WIPE NON LINTING 3.25X3.25 (MISCELLANEOUS) ×3 IMPLANT

## 2017-05-06 NOTE — Anesthesia Post-op Follow-up Note (Cosign Needed)
Anesthesia QCDR form completed.        

## 2017-05-06 NOTE — Transfer of Care (Signed)
Immediate Anesthesia Transfer of Care Note  Patient: Elaine Harmon  Procedure(s) Performed: Procedure(s) with comments: CATARACT EXTRACTION PHACO AND INTRAOCULAR LENS PLACEMENT (IOC) / ISTENT (Left) - [place] 00:51.9 AP%17.2 CDE 8.93 Fluid Pack lot # [id number]  Patient Location: Short Stay  Anesthesia Type:MAC  Level of Consciousness: awake, alert  and oriented  Airway & Oxygen Therapy: Patient Spontanous Breathing  Post-op Assessment: Post -op Vital signs reviewed and stable  Post vital signs: stable  Last Vitals:  Vitals:   05/06/17 0619 05/06/17 0822  BP: (!) 142/66 (!) 148/63  Pulse: 77 66  Resp: 18 12  Temp: 36.6 C 36.7 C    Last Pain:  Vitals:   05/06/17 0822  TempSrc: Oral         Complications: No apparent anesthesia complications

## 2017-05-06 NOTE — Op Note (Signed)
OPERATIVE NOTE  Elaine Harmon [id number] 05/06/2017  PREOPERATIVE DIAGNOSIS:   1.  Moderate open angle glaucoma, left eye. Z66.0630 2.  Nuclear sclerotic cataract left eye.  H25.12   POSTOPERATIVE DIAGNOSIS:    same.   PROCEDURE:   1.  Placement of trabecular bypass stent (istent). CPT 0191T 2.  Phacoemusification with posterior chamber intraocular lens placement of the left eye  CPT [phone]   LENS:  Implant Name Type Inv. Item Serial No. Manufacturer Lot No. LRB No. Used  LENS IOL DIOP 21.0 - [id number] Intraocular Lens LENS IOL DIOP 21.0 [id number] AMO  Left 1  ISTENT LEFT - [id number] Permanent Stent ISTENT LEFT [id number] [employer] [id number] Left 1         ULTRASOUND TIME: 0 minutes 51 seconds.  CDE 8.93   SURGEON:  Benay Pillow, MD, MPH  ANESTHESIOLOGIST: Anesthesiologist: Gunnar Fusi, MD CRNA: Nelda Marseille, CRNA; Allean Found, CRNA   ANESTHESIA:  MAC with retrobulbar block of 50/50% mix of 0.75% bupivicaine, and 4% preservative free lidocaine with a small amount of vitrase and intracameral preservative-free intracameral lidocaine 4%.  ESTIMATED BLOOD LOSS: less than 1 mL.   COMPLICATIONS:  None.   DESCRIPTION OF PROCEDURE:  The patient was identified in the holding room and transported to the operating room.   The patient was placed in the supine position under the operating microscope.   A retrobulbar block was performed with 3 cc.  The left eye was prepped and draped in the usual sterile ophthalmic fashion.   A 1.0 millimeter clear-corneal paracentesis was made at the 5:00 position. 0.5 ml of preservative-free 1% lidocaine with epinephrine was injected into the anterior chamber.  The anterior chamber was filled with Healon 5 viscoelastic.  A 2.4 millimeter keratome was used to make a near-clear corneal incision at the 2:30 position.   Attention was turned to the istent.  The patients head was turned and the microscope was tilted to 035  degrees.  Ocular instruments/Glaukos OAL/H2 gonioprism was used with IPC05 (iclip) coupled with Healon 5 on the cornea was used to visualize the trabecular meshwork. The istent was opened and introduced into the eye.  The meshwork was engaged with the tip of the iStent and then using the inserter, the iStent slid down Schlemm's canal until the stent was well seated.  The stent was then released from the inserter.  The iStent was inspected and in good position.  Next, attention was turned to the phacoemulsification A curvilinear capsulorrhexis was made with a cystotome and capsulorrhexis forceps.  Balanced salt solution was used to hydrodissect and hydrodelineate the nucleus.   Phacoemulsification was then used in stop and chop fashion to remove the lens nucleus and epinucleus.  The remaining cortex was then removed using the irrigation and aspiration handpiece. Healon was then placed into the capsular bag to distend it for lens placement.  A lens was then injected into the capsular bag.  The remaining viscoelastic was aspirated.   Wounds were hydrated with balanced salt solution.  The anterior chamber was inflated to a physiologic pressure with balanced salt solution.   Intracameral vigamox 0.1 mL undiluted was injected into the eye and a drop placed onto the ocular surface.  No wound leaks were noted.  Maxitrol ointment, a patch and shield were placed.  The patient was taken to the recovery room in stable condition without complications of anesthesia or surgery   Benay Pillow 05/06/2017, 8:22 AM

## 2017-05-06 NOTE — H&P (Signed)
The History and Physical notes are on paper, have been signed, and are to be scanned.   I have examined the patient and there are no changes to the H&P.   Elaine BladeBradley Meaghann Harmon 05/06/2017 7:09 AM

## 2017-05-06 NOTE — Anesthesia Preprocedure Evaluation (Signed)
Anesthesia Evaluation  Patient identified by MRN, date of birth, ID band Patient awake    Reviewed: Allergy & Precautions, NPO status , Patient's Chart, lab work & pertinent test results  Airway Mallampati: III  TM Distance: >3 FB     Dental   Pulmonary asthma , former smoker,    Pulmonary exam normal        Cardiovascular hypertension, Pt. on medications Normal cardiovascular exam     Neuro/Psych negative neurological ROS     GI/Hepatic negative GI ROS, Neg liver ROS,   Endo/Other  diabetes, Type 2  Renal/GU      Musculoskeletal  (+) Arthritis , Osteoarthritis,    Abdominal Normal abdominal exam  (+)   Peds  Hematology negative hematology ROS (+)   Anesthesia Other Findings   Reproductive/Obstetrics                             Anesthesia Physical  Anesthesia Plan  ASA: III  Anesthesia Plan: MAC   Post-op Pain Management:    Induction: Intravenous  Airway Management Planned: Nasal Cannula  Additional Equipment:   Intra-op Plan:   Post-operative Plan:   Informed Consent: I have reviewed the patients History and Physical, chart, labs and discussed the procedure including the risks, benefits and alternatives for the proposed anesthesia with the patient or authorized representative who has indicated his/her understanding and acceptance.     Plan Discussed with:   Anesthesia Plan Comments:         Anesthesia Quick Evaluation

## 2017-05-06 NOTE — Discharge Instructions (Signed)
Eye Surgery Discharge Instructions  Expect mild scratchy sensation or mild soreness. DO NOT RUB YOUR EYE!  The day of surgery:  Minimal physical activity, but bed rest is not required  No reading, computer work, or close hand work  No bending, lifting, or straining.  May watch TV  For 24 hours:  No driving, legal decisions, or alcoholic beverages  Safety precautions  Eat anything you prefer: It is better to start with liquids, then soup then solid foods.  _____ Eye patch should be worn until postoperative exam tomorrow.  ____ Solar shield eyeglasses should be worn for comfort in the sunlight/patch while sleeping  Resume all regular medications including aspirin or Coumadin if these were discontinued prior to surgery. You may shower, bathe, shave, or wash your hair. Tylenol may be taken for mild discomfort.  Call your doctor if you experience significant pain, nausea, or vomiting, fever > 101 or other signs of infection. [phone] or [phone] Specific instructions:  Follow-up Information    [place] CraneKing, Bradley Mark, MD Follow up on 05/07/2017.   Specialty:  Ophthalmology Why:  10:05 Contact information: [address] [place] [address] [phone]

## 2017-05-06 NOTE — Anesthesia Postprocedure Evaluation (Signed)
Anesthesia Post Note  Patient: Elaine Harmon  Procedure(s) Performed: Procedure(s) (LRB): CATARACT EXTRACTION PHACO AND INTRAOCULAR LENS PLACEMENT (IOC) / ISTENT (Left)  Patient location during evaluation: Short Stay Anesthesia Type: MAC Level of consciousness: awake and alert Pain management: pain level controlled Vital Signs Assessment: post-procedure vital signs reviewed and stable Respiratory status: spontaneous breathing, nonlabored ventilation, respiratory function stable and patient connected to nasal cannula oxygen Cardiovascular status: stable and blood pressure returned to baseline Anesthetic complications: no     Last Vitals:  Vitals:   05/06/17 0619 05/06/17 0822  BP: (!) 142/66 (!) 148/63  Pulse: 77 66  Resp: 18 12  Temp: 36.6 C 36.7 C    Last Pain:  Vitals:   05/06/17 6394  TempSrc: Oral                 Estill Batten

## 2018-04-10 IMAGING — MR MR SHOULDER*L* W/O CM
5 series · 40 of 40 positions shown · non-contrast
Comparison: None.

CLINICAL DATA: Chronic progressive left shoulder pain with limited
range of motion.

EXAM:
MRI OF THE LEFT SHOULDER WITHOUT CONTRAST
TECHNIQUE: Multiplanar, multisequence MR imaging of the shoulder was performed.
No intravenous contrast was administered.

[Series 3: T2 fat-sat · axial · 4.0mm · 0.47mm/px · z∈[-66,+40]mm · 8 of 25 slices shown (1 of 3)]
[im 1/25]
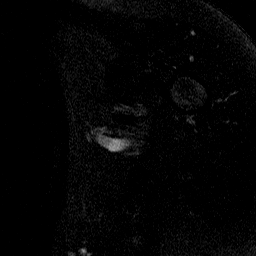
[im 4/25]
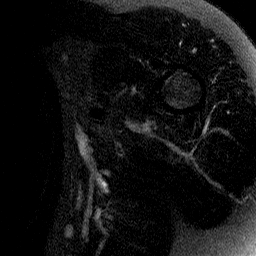
[im 7/25]
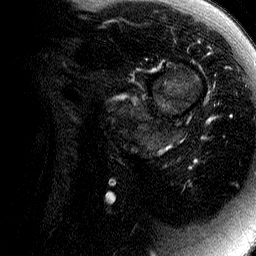
[im 11/25]
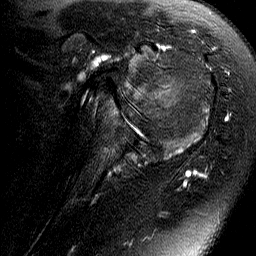
[im 14/25]
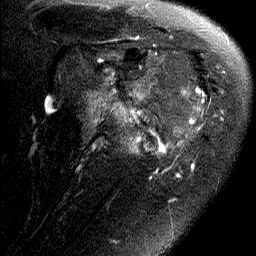
[im 18/25]
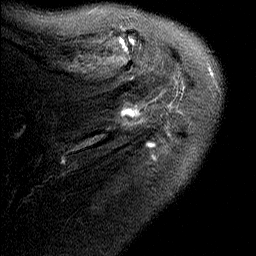
[im 21/25]
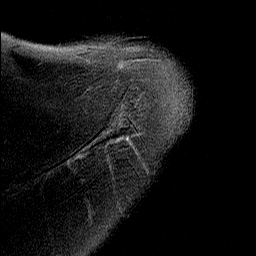
[im 25/25]
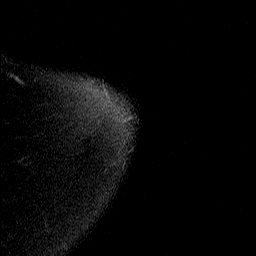

[Series 5: PD · oblique · 4.0mm · 0.62mm/px · 8 of 23 slices shown]
[im 1/23]
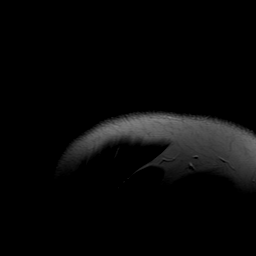
[im 4/23]
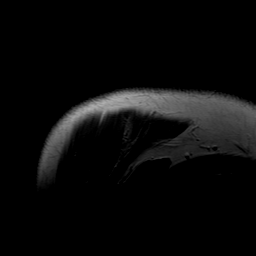
[im 7/23]
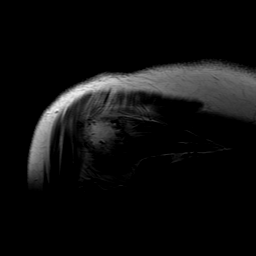
[im 10/23]
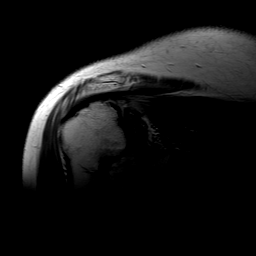
[im 13/23]
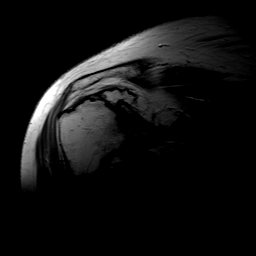
[im 16/23]
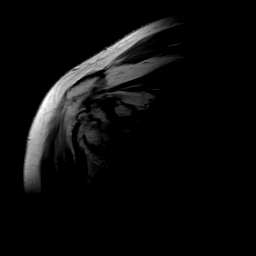
[im 19/23]
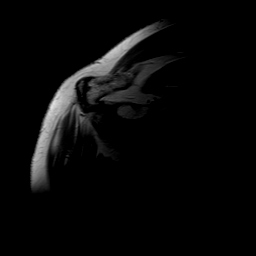
[im 23/23]
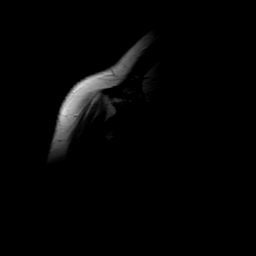

[Series 6: T1 · oblique · 4.0mm · 0.62mm/px · 8 of 23 slices shown]
[im 1/23]
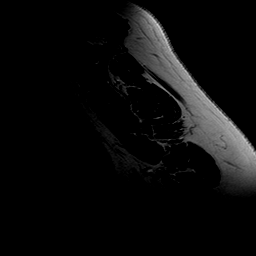
[im 4/23]
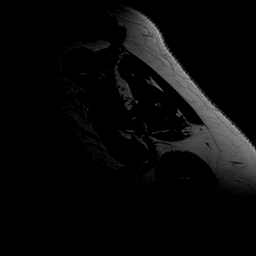
[im 7/23]
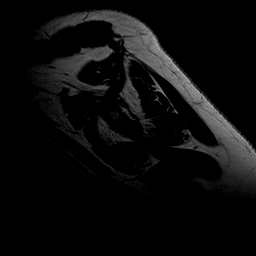
[im 10/23]
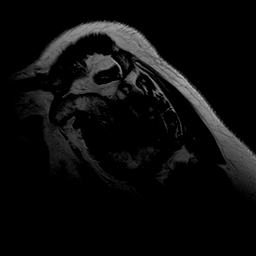
[im 13/23]
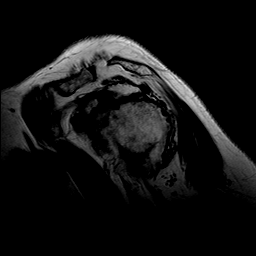
[im 16/23]
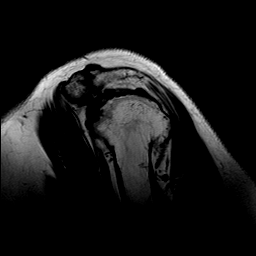
[im 19/23]
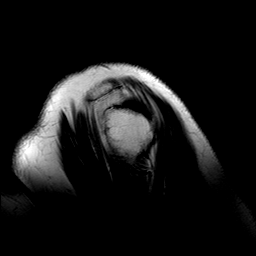
[im 23/23]
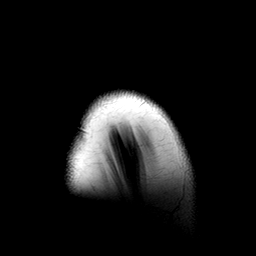

[Series 7: T2 fat-sat · oblique · 4.0mm · 0.62mm/px · 8 of 23 slices shown (2 of 3)]
[im 1/23]
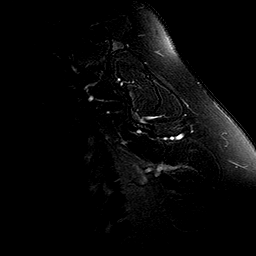
[im 4/23]
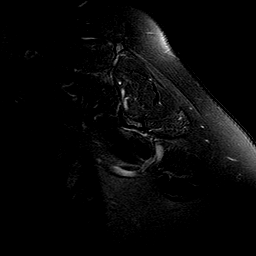
[im 7/23]
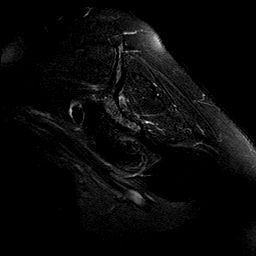
[im 10/23]
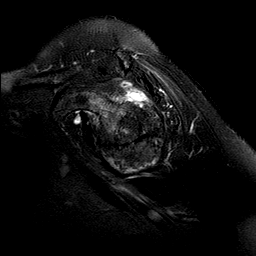
[im 13/23]
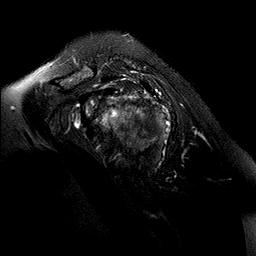
[im 16/23]
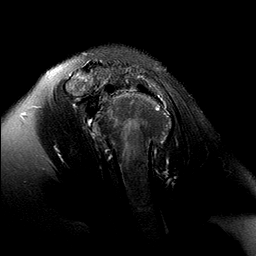
[im 19/23]
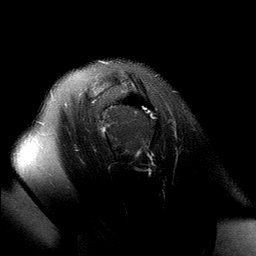
[im 23/23]
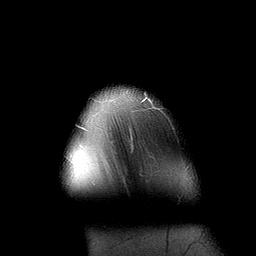

[Series 8: T2 fat-sat · oblique · 4.0mm · 0.62mm/px · 8 of 23 slices shown (3 of 3)]
[im 1/23]
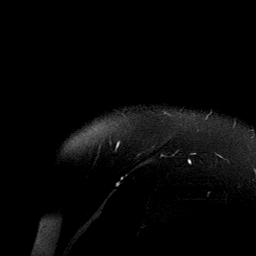
[im 4/23]
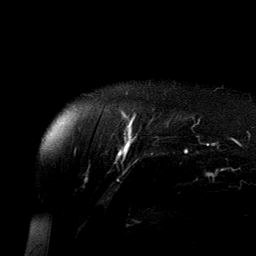
[im 7/23]
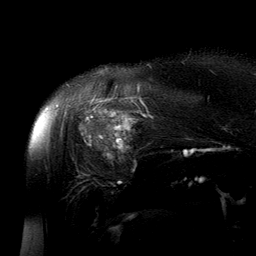
[im 10/23]
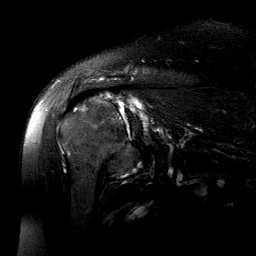
[im 13/23]
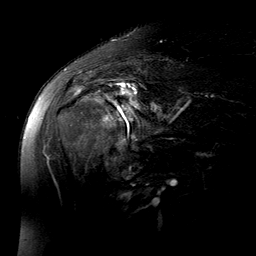
[im 16/23]
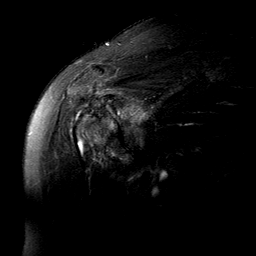
[im 19/23]
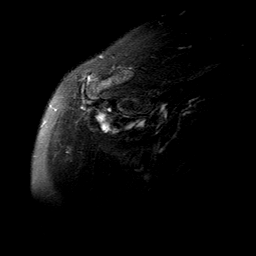
[im 23/23]
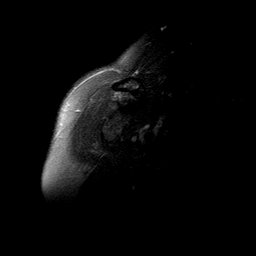

[40 of 40 positions shown; findings below may reference images not displayed]

FINDINGS: Bones: There is severe osteoarthritis of the glenohumeral joint.
Large inferior osteophytes of the humeral head and glenoid appear to
fuse the glenohumeral joint.

Rotator cuff and Muscles: : There is marked atrophy of the
infraspinatus, supraspinous and subscapularis muscles and moderate
atrophy of the teres minor muscle, all felt to be due to disuse. The
rotator cuff is quite atrophic but appears to be intact.

Biceps long head: Properly located. Longitudinal split tear of the
proximal long head of the biceps tendon.

Acromioclavicular Joint: Mild to moderate arthritis of the
acromioclavicular joint. Type 2 acromion. No discrete bursitis.

Glenohumeral Joint: Severe osteoarthritis of the glenohumeral joint
with prominent inferior osteophytes which appear to fuse the
inferior aspect of the joint. 15 mm loose body in the subcoracoid
recess of the joint. Minimal joint effusion.

Labrum:  None identified.  Probably disintegrated.

Other: None
IMPRESSION: 1. Severe osteoarthritis of the glenohumeral joint with probable
fusion of the joint secondary to inferior osteophytes on the humeral
head and glenoid.
2. Loose bodies in the glenohumeral joint.
3. Marked atrophy of the rotator cuff and rotator cuff muscles.

## 2019-03-17 ENCOUNTER — Other Ambulatory Visit: Payer: Self-pay

## 2019-03-17 ENCOUNTER — Emergency Department
Admission: EM | Admit: 2019-03-17 | Discharge: 2019-03-17 | Disposition: A | Discharge status: Home / Self Care | Payer: BLUE CROSS/BLUE SHIELD | Attending: Emergency Medicine | Admitting: Emergency Medicine

## 2019-03-17 ENCOUNTER — Encounter: Payer: Self-pay | Admitting: Medical Oncology

## 2019-03-17 ENCOUNTER — Emergency Department: Payer: BLUE CROSS/BLUE SHIELD

## 2019-03-17 DIAGNOSIS — Y9389 Activity, other specified: Secondary | ICD-10-CM | POA: Insufficient documentation

## 2019-03-17 DIAGNOSIS — S6991XA Unspecified injury of right wrist, hand and finger(s), initial encounter: Secondary | ICD-10-CM | POA: Diagnosis present

## 2019-03-17 DIAGNOSIS — Y990 Civilian activity done for income or pay: Secondary | ICD-10-CM | POA: Diagnosis not present

## 2019-03-17 DIAGNOSIS — S60041A Contusion of right ring finger without damage to nail, initial encounter: Secondary | ICD-10-CM | POA: Insufficient documentation

## 2019-03-17 DIAGNOSIS — X509XXA Other and unspecified overexertion or strenuous movements or postures, initial encounter: Secondary | ICD-10-CM | POA: Diagnosis not present

## 2019-03-17 DIAGNOSIS — I10 Essential (primary) hypertension: Secondary | ICD-10-CM | POA: Insufficient documentation

## 2019-03-17 DIAGNOSIS — I788 Other diseases of capillaries: Secondary | ICD-10-CM | POA: Diagnosis not present

## 2019-03-17 DIAGNOSIS — Y92512 Supermarket, store or market as the place of occurrence of the external cause: Secondary | ICD-10-CM | POA: Insufficient documentation

## 2019-03-17 DIAGNOSIS — Z79899 Other long term (current) drug therapy: Secondary | ICD-10-CM | POA: Insufficient documentation

## 2019-03-17 DIAGNOSIS — M7981 Nontraumatic hematoma of soft tissue: Secondary | ICD-10-CM

## 2019-03-17 NOTE — ED Triage Notes (Signed)
Pt from work with reports of rt hand ring finger injury. Finger has bruising. Pt unsure of what happened. Pt reports this is WC from [employer].

## 2019-03-17 NOTE — ED Notes (Signed)
Per Laurin Coder [phone]), point of contact based on worker comp profile for [employer], patient does not need any drug testing.

## 2019-03-17 NOTE — ED Provider Notes (Signed)
[facility] Emergency Department Provider Note  ____________________________________________  Time seen: Approximately 7:08 PM  I have reviewed the triage vital signs and the nursing notes.   HISTORY  Chief Complaint Finger Injury    HPI Elaine Harmon is a 68 y.o. female presents to the emergency department with diffuse bruising along the right ring finger.  Patient reports that she works at [employer] and was bent over packages of lunch meat and was lifting a ham over-the-counter when she noticed bruising through her gloved hand. She denies any kind of pain.  No numbness or tingling of the right ring finger.  Patient denies coolness of the right hand.  She has never experienced similar symptoms in the past.  No history of Raynauds.  Patient has been able to move right ring finger without significant discomfort.  No alleviating measures have been attempted.        Past Medical History:  Diagnosis Date  . Arthritis   . Asthma   . History of kidney stones   . Hyperlipidemia   . Hypertension     Patient Active Problem List   Diagnosis Date Noted  . Chest wall pain 11/21/2013    Past Surgical History:  Procedure Laterality Date  . CATARACT EXTRACTION W/PHACO Right 04/22/2017   Procedure: CATARACT EXTRACTION PHACO AND INTRAOCULAR LENS PLACEMENT (IOC) INSERTION OF I-STENT;  Surgeon: [place] Crane, MD;  Location: [facility] ORS;  Service: Ophthalmology;  Laterality: Right;  [place] 1:35.5 AP% 17.2 CDE 17.29 FLUID PACK LOT# [id number] H  . CATARACT EXTRACTION W/PHACO Left 05/06/2017   Procedure: CATARACT EXTRACTION PHACO AND INTRAOCULAR LENS PLACEMENT (IOC) / ISTENT;  Surgeon: [place] Crane, MD;  Location: [facility] ORS;  Service: Ophthalmology;  Laterality: Left;  [place] 00:51.9 AP%17.2 CDE 8.93 Fluid Pack lot # [id number] H  . COLONOSCOPY  2014  . EYE SURGERY    . JOINT REPLACEMENT  05/23/13   hip replacement / SHOULDER  . kidney tube resection  1996    Prior to  Admission medications   Medication Sig Start Date End Date Taking? Authorizing Provider  KRILL OIL PO Take by mouth.    [provider]  latanoprost (XALATAN) 0.005 % ophthalmic solution Place 1 drop into both eyes at bedtime.    [provider]  lisinopril (PRINIVIL,ZESTRIL) 5 MG tablet Take 5 mg by mouth daily.    [provider]    Allergies Sulfa antibiotics  Family History  Problem Relation Age of Onset  . Breast cancer Maternal Grandmother     Social History Social History   Tobacco Use  . Smoking status: Former Smoker    Types: Cigarettes  . Smokeless tobacco: Never Used  Substance Use Topics  . Alcohol use: No  . Drug use: No     Review of Systems  Constitutional: No fever/chills Eyes: No visual changes. No discharge ENT: No upper respiratory complaints. Cardiovascular: no chest pain. Respiratory: no cough. No SOB. Gastrointestinal: No abdominal pain.  No nausea, no vomiting.  No diarrhea.  No constipation. Genitourinary: Negative for dysuria. No hematuria Musculoskeletal: Patient has diffuse ecchymosis along the dorsal aspect of the right ring finger. Skin: Negative for rash, abrasions, lacerations, ecchymosis. Neurological: Negative for headaches, focal weakness or numbness.   ____________________________________________   PHYSICAL EXAM:  VITAL SIGNS: ED Triage Vitals  Enc Vitals Group     BP 03/17/19 1621 (!) 185/77     Pulse Rate 03/17/19 1621 89     Resp 03/17/19 1621 16  Temp 03/17/19 1621 97.7 F (36.5 C)     Temp Source 03/17/19 1621 Oral     SpO2 03/17/19 1621 98 %     Weight 03/17/19 1618 169 lb 12.1 oz (77 kg)     Height --      Head Circumference --      Peak Flow --      Pain Score 03/17/19 1618 0     Pain Loc --      Pain Edu? --      Excl. in [place]? --      Constitutional: Alert and oriented. Well appearing and in no acute distress. Eyes: Conjunctivae are normal. PERRL. EOMI. Head:  Atraumatic. Cardiovascular: Normal rate, regular rhythm. Normal S1 and S2.  Good peripheral circulation. Respiratory: Normal respiratory effort without tachypnea or retractions. Lungs CTAB. Good air entry to the bases with no decreased or absent breath sounds. Musculoskeletal: Patient has no deficits appreciated with flexor or extensor tendon testing of the right ring finger.  Patient has diffuse ecchymosis along the dorsal aspect of the right ring finger with mild swelling along the base of the digit.  No overlying cellulitis.  No palpable induration.  Right ring finger is warm to the touch with capillary refill less than 3 seconds. Neurologic:  Normal speech and language. No gross focal neurologic deficits are appreciated.  Skin:  Skin is warm, dry and intact. No rash noted. Psychiatric: Mood and affect are normal. Speech and behavior are normal. Patient exhibits appropriate insight and judgement.   ____________________________________________   LABS (all labs ordered are listed, but only abnormal results are displayed)  Labs Reviewed - No data to display ____________________________________________  EKG   ____________________________________________  RADIOLOGY I personally viewed and evaluated these images as part of my medical decision making, as well as reviewing the written report by the radiologist.  Dg Hand Complete Right  Result Date: 03/17/2019 CLINICAL DATA:  Swelling and bruising in the ring finger. No reported injury. EXAM: RIGHT HAND - COMPLETE 3+ VIEW COMPARISON:  None. FINDINGS: No acute fracture or dislocation is identified. There is severe osteoarthrosis involving the PIP and DIP joints of multiple fingers with bulky marginal osteophyte formation and advanced joint space narrowing. Advanced first CMC osteoarthrosis is also noted. No destructive osseous process or focal soft tissue abnormality is seen. IMPRESSION: 1. No acute osseous abnormality identified. 2. Advanced  osteoarthrosis. Electronically Signed   By: Sebastian AcheAllen  Grady M.D.   On: 03/17/2019 16:50    ____________________________________________    PROCEDURES  Procedure(s) performed:    Procedures    Medications - No data to display   ____________________________________________   INITIAL IMPRESSION / ASSESSMENT AND PLAN / ED COURSE  Pertinent labs & imaging results that were available during my care of the patient were reviewed by me and considered in my medical decision making (see chart for details).  Review of the [place] CSRS was performed in accordance of the [facility] prior to dispensing any controlled drugs.           Assessment and Plan:  Achenbach syndrome.  68 year old female presents to the emergency department with idiopathic bruising along the dorsal aspect of the right ring finger without pain.  On physical exam, patient had no redness or pain with flexor or extensor tendon testing.  Digit was warm to the touch with capillary refill less than 3 seconds.   Differential diagnosis included Achenbach syndrome, raynauds phenomenon, flexor or extensor tendon injury and fracture  Physical exam findings were  not consistent with flexor or extensor tendon injury.  There was no evidence of fracture on x-ray of the right hand.  Patient has no history of raynauds.  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   Moist heat was recommended to facilitate reabsorption of hematoma.  Reassurance was given.  A work note was also provided for patient's place of work.  All patient questions were answered.   ____________________________________________  FINAL CLINICAL IMPRESSION(S) / ED DIAGNOSES  Final diagnoses:  Achenbach's syndrome      NEW MEDICATIONS STARTED DURING THIS VISIT:  ED Discharge Orders    None          This chart was dictated using voice recognition software/Dragon. Despite best efforts to proofread, errors can occur which can change the meaning. Any change was  purely unintentional.    Orvil Feil, PA-C 03/17/19 1917    Minna Antis, MD 03/17/19 2159

## 2019-07-29 ENCOUNTER — Emergency Department
Admission: EM | Admit: 2019-07-29 | Discharge: 2019-07-29 | Disposition: A | Discharge status: Home / Self Care | Payer: BC Managed Care – PPO | Attending: Emergency Medicine | Admitting: Emergency Medicine

## 2019-07-29 ENCOUNTER — Other Ambulatory Visit: Payer: Self-pay

## 2019-07-29 DIAGNOSIS — Z79899 Other long term (current) drug therapy: Secondary | ICD-10-CM | POA: Diagnosis not present

## 2019-07-29 DIAGNOSIS — J45909 Unspecified asthma, uncomplicated: Secondary | ICD-10-CM | POA: Insufficient documentation

## 2019-07-29 DIAGNOSIS — M79651 Pain in right thigh: Secondary | ICD-10-CM | POA: Diagnosis present

## 2019-07-29 DIAGNOSIS — L02415 Cutaneous abscess of right lower limb: Secondary | ICD-10-CM | POA: Diagnosis not present

## 2019-07-29 DIAGNOSIS — Z87891 Personal history of nicotine dependence: Secondary | ICD-10-CM | POA: Insufficient documentation

## 2019-07-29 DIAGNOSIS — I10 Essential (primary) hypertension: Secondary | ICD-10-CM | POA: Diagnosis not present

## 2019-07-29 DIAGNOSIS — L0291 Cutaneous abscess, unspecified: Secondary | ICD-10-CM

## 2019-07-29 MED ORDER — OXYCODONE-ACETAMINOPHEN 7.5-325 MG PO TABS: 1.0000 | ORAL_TABLET | Freq: Four times a day (QID) | ORAL | 0 refills | Status: AC | PRN

## 2019-07-29 MED ORDER — CEPHALEXIN 500 MG PO CAPS: 500.0000 mg | ORAL_CAPSULE | Freq: Four times a day (QID) | ORAL | 0 refills | Status: AC

## 2019-07-29 MED ORDER — LIDOCAINE HCL (PF) 1 % IJ SOLN
5.0000 mL | Freq: Once | INTRAMUSCULAR | Status: AC
  Administered 2019-07-29: 5 mL
  Filled 2019-07-29: qty 5

## 2019-07-29 NOTE — ED Provider Notes (Signed)
[facility] Emergency Department Provider Note   ____________________________________________   First MD Initiated Contact with Patient 07/29/19 [phone]     (approximate)  I have reviewed the triage vital signs and the nursing notes.   HISTORY  Chief Complaint Abscess    HPI Elaine Harmon is a 68 y.o. female patient presents with abscess medial thigh for right lower extremity.  Patient states lesion has increased in size in the past 3 days.  Patient denies drainage.  No fever associated with complaint.  Patient rates pain as a 5/10.  Patient described the pain is "uncomfortable".  No palliative measure for complaint.         Past Medical History:  Diagnosis Date  . Arthritis   . Asthma   . History of kidney stones   . Hyperlipidemia   . Hypertension     Patient Active Problem List   Diagnosis Date Noted  . Chest wall pain 11/21/2013    Past Surgical History:  Procedure Laterality Date  . CATARACT EXTRACTION W/PHACO Right 04/22/2017   Procedure: CATARACT EXTRACTION PHACO AND INTRAOCULAR LENS PLACEMENT (IOC) INSERTION OF I-STENT;  Surgeon: [place] CraneKing, Bradley Mark, MD;  Location: [facility] ORS;  Service: Ophthalmology;  Laterality: Right;  [place] 1:35.5 AP% 17.2 CDE 17.29 FLUID PACK LOT# [id number] H  . CATARACT EXTRACTION W/PHACO Left 05/06/2017   Procedure: CATARACT EXTRACTION PHACO AND INTRAOCULAR LENS PLACEMENT (IOC) / ISTENT;  Surgeon: [place] CraneKing, Bradley Mark, MD;  Location: [facility] ORS;  Service: Ophthalmology;  Laterality: Left;  [place] 00:51.9 AP%17.2 CDE 8.93 Fluid Pack lot # [id number] H  . COLONOSCOPY  2014  . EYE SURGERY    . JOINT REPLACEMENT  05/23/13   hip replacement / SHOULDER  . kidney tube resection  1996    Prior to Admission medications   Medication Sig Start Date End Date Taking? Authorizing Provider  cephALEXin (KEFLEX) 500 MG capsule Take 1 capsule (500 mg total) by mouth 4 (four) times daily for 10 days. 07/29/19 08/08/19  Joni ReiningSmith, Ronald K, PA-C   KRILL OIL PO Take by mouth.    [provider]  latanoprost (XALATAN) 0.005 % ophthalmic solution Place 1 drop into both eyes at bedtime.    [provider]  lisinopril (PRINIVIL,ZESTRIL) 5 MG tablet Take 5 mg by mouth daily.    [provider]  oxyCODONE-acetaminophen (PERCOCET) 7.5-325 MG tablet Take 1 tablet by mouth every 6 (six) hours as needed for up to 5 days. 07/29/19 08/03/19  Joni ReiningSmith, Ronald K, PA-C    Allergies Sulfa antibiotics  Family History  Problem Relation Age of Onset  . Breast cancer Maternal Grandmother     Social History Social History   Tobacco Use  . Smoking status: Former Smoker    Types: Cigarettes  . Smokeless tobacco: Never Used  Substance Use Topics  . Alcohol use: No  . Drug use: No    Review of Systems  Constitutional: No fever/chills Eyes: No visual changes. ENT: No sore throat. Cardiovascular: Denies chest pain. Respiratory: Denies shortness of breath. Gastrointestinal: No abdominal pain.  No nausea, no vomiting.  No diarrhea.  No constipation. Genitourinary: Negative for dysuria. Musculoskeletal: Negative for back pain. Skin: Abscess right thigh fold area.   Neurological: Negative for headaches, focal weakness or numbness. Endocrine:  Hyperlipidemia and hypertension. Allergic/Immunilogical: Sulfa antibiotics.  ____________________________________________   PHYSICAL EXAM:  VITAL SIGNS: ED Triage Vitals  Enc Vitals Group     BP 07/29/19 0845 (!) 159/82     Pulse Rate 07/29/19  0845 93     Resp 07/29/19 0845 18     Temp 07/29/19 0845 99 F (37.2 C)     Temp Source 07/29/19 0845 Oral     SpO2 07/29/19 0845 97 %     Weight 07/29/19 0842 200 lb (90.7 kg)     Height 07/29/19 0842 5\' 2"  (1.575 m)     Head Circumference --      Peak Flow --      Pain Score 07/29/19 0857 5     Pain Loc --      Pain Edu? --      Excl. in [place]? --     Constitutional: Alert and oriented. Well appearing and in no acute distress.  Cardiovascular: Normal rate, regular rhythm. Grossly normal heart sounds.  Good peripheral circulation.  Elevated blood pressure. Respiratory: Normal respiratory effort.  No retractions. Lungs CTAB. Neurologic:  Normal speech and language. No gross focal neurologic deficits are appreciated. No gait instability. Skin: Nodule lesion right medial thigh fold.Marland Kitchen Psychiatric: Mood and affect are normal. Speech and behavior are normal.  ____________________________________________   LABS (all labs ordered are listed, but only abnormal results are displayed)  Labs Reviewed - No data to display ____________________________________________  EKG   ____________________________________________  RADIOLOGY  ED MD interpretation:    Official radiology report(s): No results found.  ____________________________________________   PROCEDURES  Procedure(s) performed (including Critical Care):  Marland KitchenMarland KitchenIncision and Drainage  Date/Time: 07/29/2019 9:55 AM Performed by: Sable Feil, PA-C Authorized by: Sable Feil, PA-C   Consent:    Consent obtained:  Verbal   Consent given by:  Patient   Risks discussed:  Bleeding, incomplete drainage, pain and infection Location:    Type:  Abscess   Location:  Anogenital Pre-procedure details:    Skin preparation:  Betadine Anesthesia (see MAR for exact dosages):    Anesthesia method:  Local infiltration   Local anesthetic:  Lidocaine 1% w/o epi Procedure type:    Complexity:  Complex Procedure details:    Incision types:  Single with marsupialization   Incision depth:  Dermal   Scalpel blade:  11   Wound management:  Probed and deloculated and irrigated with saline   Drainage:  Purulent   Drainage amount:  Moderate   Packing materials:  1/4 in iodoform gauze   Amount 1/4" iodoform:  4  Post-procedure details:    Patient tolerance of procedure:  Tolerated well, no immediate complications     ____________________________________________    INITIAL IMPRESSION / ASSESSMENT AND PLAN / ED COURSE  As part of my medical decision making, I reviewed the following data within the [place] was evaluated in Emergency Department on 07/29/2019 for the symptoms described in the history of present illness. She was evaluated in the context of the global COVID-19 pandemic, which necessitated consideration that the patient might be at risk for infection with the SARS-CoV-2 virus that causes COVID-19. Institutional protocols and algorithms that pertain to the evaluation of patients at risk for COVID-19 are in a state of rapid change based on information released by regulatory bodies including the CDC and federal and state organizations. These policies and algorithms were followed during the patient's care in the ED.    Patient presents with a nodule lesion right medial thigh fold.  Physical exam consistent with abscess.  See procedure note.  Patient given discharge care instruction advised take medication as directed.  Follow-up in 2 days for wound check.   ____________________________________________   FINAL CLINICAL IMPRESSION(S) / ED DIAGNOSES  Final diagnoses:  Abscess     ED Discharge Orders         Ordered    cephALEXin (KEFLEX) 500 MG capsule  4 times daily     07/29/19 0952    oxyCODONE-acetaminophen (PERCOCET) 7.5-325 MG tablet  Every 6 hours PRN     07/29/19 [id number]           Note:  This document was prepared using Dragon voice recognition software and may include unintentional dictation errors.    Joni ReiningSmith, Ronald K, PA-C 07/29/19 [id number]    Shaune PollackIsaacs, Cameron, MD 07/30/19 1025

## 2019-07-29 NOTE — ED Triage Notes (Signed)
Pt c/o abscess to groin area, increased pain for the past several days.

## 2019-07-31 ENCOUNTER — Encounter: Payer: Self-pay | Admitting: Emergency Medicine

## 2019-07-31 ENCOUNTER — Emergency Department
Admission: EM | Admit: 2019-07-31 | Discharge: 2019-07-31 | Disposition: A | Discharge status: Home / Self Care | Payer: BC Managed Care – PPO | Attending: Emergency Medicine | Admitting: Emergency Medicine

## 2019-07-31 ENCOUNTER — Other Ambulatory Visit: Payer: Self-pay

## 2019-07-31 DIAGNOSIS — Z79899 Other long term (current) drug therapy: Secondary | ICD-10-CM | POA: Diagnosis not present

## 2019-07-31 DIAGNOSIS — I10 Essential (primary) hypertension: Secondary | ICD-10-CM | POA: Insufficient documentation

## 2019-07-31 DIAGNOSIS — Z4800 Encounter for change or removal of nonsurgical wound dressing: Secondary | ICD-10-CM | POA: Diagnosis not present

## 2019-07-31 DIAGNOSIS — Z87891 Personal history of nicotine dependence: Secondary | ICD-10-CM | POA: Diagnosis not present

## 2019-07-31 DIAGNOSIS — Z5189 Encounter for other specified aftercare: Secondary | ICD-10-CM

## 2019-07-31 DIAGNOSIS — L02415 Cutaneous abscess of right lower limb: Secondary | ICD-10-CM | POA: Diagnosis not present

## 2019-07-31 DIAGNOSIS — Z96649 Presence of unspecified artificial hip joint: Secondary | ICD-10-CM | POA: Diagnosis not present

## 2019-07-31 DIAGNOSIS — J45909 Unspecified asthma, uncomplicated: Secondary | ICD-10-CM | POA: Diagnosis not present

## 2019-07-31 DIAGNOSIS — Z96619 Presence of unspecified artificial shoulder joint: Secondary | ICD-10-CM | POA: Diagnosis not present

## 2019-07-31 NOTE — ED Notes (Signed)
See triage note  Presents for wound check  States she is here to have packing removed for abscess area to right inner thigh

## 2019-07-31 NOTE — Discharge Instructions (Addendum)
Continue previous medication follow discharge care instructions. °

## 2019-07-31 NOTE — ED Triage Notes (Signed)
Had abscess drained on satureday and is here for follow up

## 2019-07-31 NOTE — ED Provider Notes (Signed)
[facility] Emergency Department Provider Note   ____________________________________________   First MD Initiated Contact with Patient 07/31/19 [phone]     (approximate)  I have reviewed the triage vital signs and the nursing notes.   HISTORY  Chief Complaint Wound Check    HPI Elaine Harmon is a 68 y.o. female patient presents for wound check secondary to incision and drainage of abscess to the right medial thigh fold area.  Patient states pain is decreased and no drainage with dressing change at home.  Patient rates her pain now as a 4/10.  Patient described the pain is "sore".  Patient continue antibiotics and pain medication as directed.         Past Medical History:  Diagnosis Date  . Arthritis   . Asthma   . History of kidney stones   . Hyperlipidemia   . Hypertension     Patient Active Problem List   Diagnosis Date Noted  . Chest wall pain 11/21/2013    Past Surgical History:  Procedure Laterality Date  . CATARACT EXTRACTION W/PHACO Right 04/22/2017   Procedure: CATARACT EXTRACTION PHACO AND INTRAOCULAR LENS PLACEMENT (IOC) INSERTION OF I-STENT;  Surgeon: [place] CraneKing, Bradley Mark, MD;  Location: [facility] ORS;  Service: Ophthalmology;  Laterality: Right;  [place] 1:35.5 AP% 17.2 CDE 17.29 FLUID PACK LOT# [id number] H  . CATARACT EXTRACTION W/PHACO Left 05/06/2017   Procedure: CATARACT EXTRACTION PHACO AND INTRAOCULAR LENS PLACEMENT (IOC) / ISTENT;  Surgeon: [place] CraneKing, Bradley Mark, MD;  Location: [facility] ORS;  Service: Ophthalmology;  Laterality: Left;  US 00:51.9 AP%17.2 CDE 8.93 Fluid Pack lot # [id number] H  . COLONOSCOPY  2014  . EYE SURGERY    . JOINT REPLACEMENT  05/23/13   hip replacement / SHOULDER  . kidney tube resection  1996    Prior to Admission medications   Medication Sig Start Date End Date Taking? Authorizing Provider  cephALEXin (KEFLEX) 500 MG capsule Take 1 capsule (500 mg total) by mouth 4 (four) times daily for 10 days. 07/29/19 08/08/19   Joni ReiningSmith, Ronald K, PA-C  KRILL OIL PO Take by mouth.    [provider]  latanoprost (XALATAN) 0.005 % ophthalmic solution Place 1 drop into both eyes at bedtime.    [provider]  lisinopril (PRINIVIL,ZESTRIL) 5 MG tablet Take 5 mg by mouth daily.    [provider]  oxyCODONE-acetaminophen (PERCOCET) 7.5-325 MG tablet Take 1 tablet by mouth every 6 (six) hours as needed for up to 5 days. 07/29/19 08/03/19  Joni ReiningSmith, Ronald K, PA-C    Allergies Sulfa antibiotics  Family History  Problem Relation Age of Onset  . Breast cancer Maternal Grandmother     Social History Social History   Tobacco Use  . Smoking status: Former Smoker    Types: Cigarettes  . Smokeless tobacco: Never Used  Substance Use Topics  . Alcohol use: No  . Drug use: No    Review of Systems  Constitutional: No fever/chills Eyes: No visual changes. ENT: No sore throat. Cardiovascular: Denies chest pain. Respiratory: Denies shortness of breath. Gastrointestinal: No abdominal pain.  No nausea, no vomiting.  No diarrhea.  No constipation. Genitourinary: Negative for dysuria. Musculoskeletal: Negative for back pain. Skin: Negative for rash.  Abscess right medial thigh fold. Neurological: Negative for headaches, focal weakness or numbness. Endocrine:  Hyperlipidemia hypertension Allergic/Immunilogical: Sulfur antibiotic ____________________________________________   PHYSICAL EXAM:  VITAL SIGNS: ED Triage Vitals  Enc Vitals Group     BP 07/31/19 0904 (!) 141/64  Pulse Rate 07/31/19 0904 81     Resp 07/31/19 0904 14     Temp 07/31/19 0904 98.7 F (37.1 C)     Temp Source 07/31/19 0904 Oral     SpO2 07/31/19 0904 95 %     Weight --      Height --      Head Circumference --      Peak Flow --      Pain Score 07/31/19 0903 4     Pain Loc --      Pain Edu? --      Excl. in [place]? --    Constitutional: Alert and oriented. Well appearing and in no acute distress. Cardiovascular:  Normal rate, regular rhythm. Grossly normal heart sounds.  Good peripheral circulation. Respiratory: Normal respiratory effort.  No retractions. Lungs CTAB. Skin: Decrease edema and erythema to abscess area. Psychiatric: Mood and affect are normal. Speech and behavior are normal.  ____________________________________________   LABS (all labs ordered are listed, but only abnormal results are displayed)  Labs Reviewed - No data to display ____________________________________________  EKG   ____________________________________________  RADIOLOGY  ED MD interpretation:    Official radiology report(s): No results found.  ____________________________________________   PROCEDURES  Procedure(s) performed (including Critical Care):  Procedures   ____________________________________________   INITIAL IMPRESSION / ASSESSMENT AND PLAN / ED COURSE  As part of my medical decision making, I reviewed the following data within the [place] was evaluated in Emergency Department on 07/31/2019 for the symptoms described in the history of present illness. She was evaluated in the context of the global COVID-19 pandemic, which necessitated consideration that the patient might be at risk for infection with the SARS-CoV-2 virus that causes COVID-19. Institutional protocols and algorithms that pertain to the evaluation of patients at risk for COVID-19 are in a state of rapid change based on information released by regulatory bodies including the CDC and federal and state organizations. These policies and algorithms were followed during the patient's care in the ED.  Patient presents for wound check secondary to incision and drainage 2 days ago.  Packing material was removed with no drainage.  Wound was irrigated with clear return.  Patient area was cleaned and bandaged.  Patient given discharge care instruction advised continue previous medications.   Return to ED if condition worsens.      ____________________________________________   FINAL CLINICAL IMPRESSION(S) / ED DIAGNOSES  Final diagnoses:  Wound check, abscess     ED Discharge Orders    None       Note:  This document was prepared using Dragon voice recognition software and may include unintentional dictation errors.    Sable Feil, PA-C 07/31/19 0932    Blake Divine, MD 07/31/19 1500

## 2020-09-01 ENCOUNTER — Emergency Department
Admission: EM | Admit: 2020-09-01 | Discharge: 2020-09-01 | Disposition: A | Discharge status: Home / Self Care | Payer: BC Managed Care – PPO | Attending: Emergency Medicine | Admitting: Emergency Medicine

## 2020-09-01 ENCOUNTER — Emergency Department: Payer: BC Managed Care – PPO

## 2020-09-01 ENCOUNTER — Other Ambulatory Visit: Payer: Self-pay

## 2020-09-01 DIAGNOSIS — T7840XA Allergy, unspecified, initial encounter: Secondary | ICD-10-CM | POA: Insufficient documentation

## 2020-09-01 DIAGNOSIS — R5383 Other fatigue: Secondary | ICD-10-CM | POA: Diagnosis present

## 2020-09-01 DIAGNOSIS — Z87891 Personal history of nicotine dependence: Secondary | ICD-10-CM | POA: Insufficient documentation

## 2020-09-01 DIAGNOSIS — Z96649 Presence of unspecified artificial hip joint: Secondary | ICD-10-CM | POA: Diagnosis not present

## 2020-09-01 DIAGNOSIS — I10 Essential (primary) hypertension: Secondary | ICD-10-CM | POA: Insufficient documentation

## 2020-09-01 DIAGNOSIS — J45909 Unspecified asthma, uncomplicated: Secondary | ICD-10-CM | POA: Diagnosis not present

## 2020-09-01 DIAGNOSIS — J189 Pneumonia, unspecified organism: Secondary | ICD-10-CM | POA: Insufficient documentation

## 2020-09-01 DIAGNOSIS — Z79899 Other long term (current) drug therapy: Secondary | ICD-10-CM | POA: Diagnosis not present

## 2020-09-01 DIAGNOSIS — Z96619 Presence of unspecified artificial shoulder joint: Secondary | ICD-10-CM | POA: Insufficient documentation

## 2020-09-01 LAB — COMPREHENSIVE METABOLIC PANEL
ALT: 33 U/L (ref 0–44)
AST: 47 U/L — ABNORMAL HIGH (ref 15–41)
Albumin: 3.7 g/dL (ref 3.5–5.0)
Alkaline Phosphatase: 61 U/L (ref 38–126)
Anion gap: 10 (ref 5–15)
BUN: 13 mg/dL (ref 8–23)
CO2: 24 mmol/L (ref 22–32)
Calcium: 8.4 mg/dL — ABNORMAL LOW (ref 8.9–10.3)
Chloride: 100 mmol/L (ref 98–111)
Creatinine, Ser: 0.85 mg/dL (ref 0.44–1.00)
GFR calc Af Amer: >60 mL/min (ref >60)
GFR calc non Af Amer: >60 mL/min (ref >60)
Glucose, Bld: 128 mg/dL — ABNORMAL HIGH (ref 70–99)
Potassium: 3.9 mmol/L (ref 3.5–5.1)
Sodium: 134 mmol/L — ABNORMAL LOW (ref 135–145)
Total Bilirubin: 0.7 mg/dL (ref 0.3–1.2)
Total Protein: 7.4 g/dL (ref 6.5–8.1)

## 2020-09-01 LAB — CBC
HCT: 41.2 % (ref 36.0–46.0)
Hemoglobin: 13.9 g/dL (ref 12.0–15.0)
MCH: 29.8 pg (ref 26.0–34.0)
MCHC: 33.7 g/dL (ref 30.0–36.0)
MCV: 88.2 fL (ref 80.0–100.0)
Platelets: 146 10*3/uL — ABNORMAL LOW (ref 150–400)
RBC: 4.67 MIL/uL (ref 3.87–5.11)
RDW: 12.8 % (ref 11.5–15.5)
WBC: 4.3 10*3/uL (ref 4.0–10.5)
nRBC: 0 % (ref 0.0–0.2)

## 2020-09-01 LAB — URINALYSIS, COMPLETE (UACMP) WITH MICROSCOPIC
Bacteria, UA: NONE SEEN
Bilirubin Urine: NEGATIVE
Glucose, UA: NEGATIVE mg/dL
Hgb urine dipstick: NEGATIVE
Ketones, ur: NEGATIVE mg/dL
Leukocytes,Ua: NEGATIVE
Nitrite: NEGATIVE
Protein, ur: NEGATIVE mg/dL
Specific Gravity, Urine: 1.018 (ref 1.005–1.030)
pH: 5 (ref 5.0–8.0)

## 2020-09-01 MED ORDER — AZITHROMYCIN 250 MG PO TABS: ORAL_TABLET | ORAL | 0 refills | Status: AC

## 2020-09-01 MED ORDER — ALBUTEROL SULFATE HFA 108 (90 BASE) MCG/ACT IN AERS: 2.0000 | INHALATION_SPRAY | RESPIRATORY_TRACT | 1 refills | Status: AC | PRN

## 2020-09-01 MED ORDER — PREDNISONE 10 MG PO TABS: 50.0000 mg | ORAL_TABLET | Freq: Every day | ORAL | 0 refills | Status: AC

## 2020-09-01 NOTE — ED Notes (Signed)
Pt encouraged to accept testing for covid, pt refused. Pt states she has pneumonia because she has to wear a mask. Pt encouraged to return to ER with any worsening symptoms.

## 2020-09-01 NOTE — Discharge Instructions (Signed)
Please follow-up with [facility] clinic or the primary care provider of your choice for symptoms that are not improving over the next few days.  Either way, you will need to have a repeat chest x-ray in 3 to 4 weeks to make sure that the pneumonia has resolved.  Return to the emergency department for symptoms of change or worsen if you are unable to see primary care or go to urgent care.

## 2020-09-01 NOTE — ED Provider Notes (Signed)
[facility] Emergency Department Provider Note ____________________________________________   First MD Initiated Contact with Patient 09/01/20 1342     (approximate)  I have reviewed the triage vital signs and the nursing notes.   HISTORY  Chief Complaint Allergies and Fatigue  HPI Elaine Harmon is a 69 y.o. female with history of asthma presents to the emergency department for treatment and evaluation of shortness of breath, cough, and fatigue.  Also complaining of dysuria with dark orange urine.  No fever.     Past Medical History:  Diagnosis Date  . Arthritis   . Asthma   . History of kidney stones   . Hyperlipidemia   . Hypertension     Patient Active Problem List   Diagnosis Date Noted  . Chest wall pain 11/21/2013    Past Surgical History:  Procedure Laterality Date  . CATARACT EXTRACTION W/PHACO Right 04/22/2017   Procedure: CATARACT EXTRACTION PHACO AND INTRAOCULAR LENS PLACEMENT (IOC) INSERTION OF I-STENT;  Surgeon: Nevada Crane, MD;  Location: [facility] ORS;  Service: Ophthalmology;  Laterality: Right;  [place] 1:35.5 AP% 17.2 CDE 17.29 FLUID PACK LOT# [id number] H  . CATARACT EXTRACTION W/PHACO Left 05/06/2017   Procedure: CATARACT EXTRACTION PHACO AND INTRAOCULAR LENS PLACEMENT (IOC) / ISTENT;  Surgeon: Nevada Crane, MD;  Location: [facility] ORS;  Service: Ophthalmology;  Laterality: Left;  [place] 00:51.9 AP%17.2 CDE 8.93 Fluid Pack lot # [id number] H  . COLONOSCOPY  2014  . EYE SURGERY    . JOINT REPLACEMENT  05/23/13   hip replacement / SHOULDER  . kidney tube resection  1996    Prior to Admission medications   Medication Sig Start Date End Date Taking? Authorizing Provider  albuterol (VENTOLIN HFA) 108 (90 Base) MCG/ACT inhaler Inhale 2 puffs into the lungs every 4 (four) hours as needed for wheezing or shortness of breath. 09/01/20   Pearlina Friedly, Rulon Eisenmenger B, FNP  azithromycin (ZITHROMAX) 250 MG tablet 2 tablets today, then 1 tablet for the next  4 days. 09/01/20   Cassadee Vanzandt B, FNP  KRILL OIL PO Take by mouth.    [provider]  latanoprost (XALATAN) 0.005 % ophthalmic solution Place 1 drop into both eyes at bedtime.    [provider]  lisinopril (PRINIVIL,ZESTRIL) 5 MG tablet Take 5 mg by mouth daily.    [provider]  predniSONE (DELTASONE) 10 MG tablet Take 5 tablets (50 mg total) by mouth daily. 09/01/20   Gregg Winchell, Rulon Eisenmenger B, FNP    Allergies Sulfa antibiotics  Family History  Problem Relation Age of Onset  . Breast cancer Maternal Grandmother     Social History Social History   Tobacco Use  . Smoking status: Former Smoker    Types: Cigarettes  . Smokeless tobacco: Never Used  Substance Use Topics  . Alcohol use: No  . Drug use: No    Review of Systems  Constitutional: No fever/chills Eyes: No visual changes. ENT: No sore throat. Cardiovascular: Denies chest pain. Respiratory: Denies shortness of breath. Gastrointestinal: No abdominal pain.  No nausea, no vomiting.  No diarrhea.  No constipation. Genitourinary: Negative for dysuria. Musculoskeletal: Negative for back pain. Skin: Negative for rash. Neurological: Negative for headaches, focal weakness or numbness. ____________________________________________   PHYSICAL EXAM:  VITAL SIGNS: ED Triage Vitals  Enc Vitals Group     BP 09/01/20 0818 (!) 143/56     Pulse Rate 09/01/20 0818 84     Resp 09/01/20 0818 20     Temp 09/01/20 0818  98.9 F (37.2 C)     Temp Source 09/01/20 0818 Oral     SpO2 09/01/20 0818 95 %     Weight 09/01/20 0819 190 lb (86.2 kg)     Height 09/01/20 0819 5\' 2"  (1.575 m)     Head Circumference --      Peak Flow --      Pain Score 09/01/20 0819 0     Pain Loc --      Pain Edu? --      Excl. in [place]? --     Constitutional: Alert and oriented. Well appearing and in no acute distress. Eyes: Conjunctivae are normal. PERRL. EOMI. Head: Atraumatic. Nose: No congestion/rhinnorhea. Mouth/Throat:  Mucous membranes are moist.  Oropharynx non-erythematous. Neck: No stridor.   Hematological/Lymphatic/Immunilogical: No cervical lymphadenopathy. Cardiovascular: Normal rate, regular rhythm. Grossly normal heart sounds.  Good peripheral circulation. Respiratory: Normal respiratory effort.  No retractions. Lungs CTAB. Gastrointestinal: Soft and nontender. No distention. No abdominal bruits. No CVA tenderness. Genitourinary:  Musculoskeletal: No lower extremity tenderness nor edema.  No joint effusions. Neurologic:  Normal speech and language. No gross focal neurologic deficits are appreciated. No gait instability. Skin:  Skin is warm, dry and intact. No rash noted. Psychiatric: Mood and affect are normal. Speech and behavior are normal.  ____________________________________________   LABS (all labs ordered are listed, but only abnormal results are displayed)  Labs Reviewed  CBC - Abnormal; Notable for the following components:      Result Value   Platelets 146 (*)    All other components within normal limits  COMPREHENSIVE METABOLIC PANEL - Abnormal; Notable for the following components:   Sodium 134 (*)    Glucose, Bld 128 (*)    Calcium 8.4 (*)    AST 47 (*)    All other components within normal limits  URINALYSIS, COMPLETE (UACMP) WITH MICROSCOPIC - Abnormal; Notable for the following components:   Color, Urine YELLOW (*)    APPearance HAZY (*)    All other components within normal limits   ____________________________________________  EKG  ED ECG REPORT I, Alexsia Klindt, FNP-BC personally viewed and interpreted this ECG.   Date: 09/01/2020  EKG Time: 0821  Rate: 82  Rhythm: normal sinus rhythm  Axis: normal  Intervals:none  ST&T Change: no ST elevation  ____________________________________________  RADIOLOGY  ED MD interpretation:    Bilateral infiltrates in lower lobes.  I, 0822, personally viewed and evaluated these images (plain radiographs) as  part of my medical decision making, as well as reviewing the written report by the radiologist.  Official radiology report(s): DG Chest 2 View  Result Date: 09/01/2020 CLINICAL DATA:  Cough and fatigue with weakness EXAM: CHEST - 2 VIEW COMPARISON:  07/06/2009 FINDINGS: Patchy bilateral pulmonary infiltrate which are focal but indistinct and not particularly dense. Borderline heart size. Aortic tortuosity. No edema, effusion, or pneumothorax. Bilateral reverse glenohumeral arthroplasty. Degenerative endplate spurring which is bulky. IMPRESSION: Patchy bilateral pulmonary infiltrate. Followup PA and lateral chest X-ray is recommended in 3-4 weeks to ensure resolution. Electronically Signed   By: 07/08/2009 M.D.   On: 09/01/2020 09:36    ____________________________________________   PROCEDURES  Procedure(s) performed (including Critical Care):  Procedures  ____________________________________________   INITIAL IMPRESSION / ASSESSMENT AND PLAN     69 year old female presenting to the emergency department for treatment and evaluation of shortness of breath, dry cough, fatigue, and dark urine that started several days ago.  No known fever.  She has had some  dysuria without any urine urgency.  She also reports decreased appetite.  DIFFERENTIAL DIAGNOSIS  COVID-19, asthma, viral syndrome, pneumonia  ED COURSE  Labs, urinalysis, chest x-ray obtained while awaiting ER room assignment.  Labs are overall reassuring with a normal white blood cell count, overall unremarkable CMP and a normal urinalysis.  Patient states that she "feels dehydrated" however BUN and creatinine are normal and there are no ketones in her urine.  Plan will be to treat her with azithromycin, prednisone, and prescribe an inhaler.  Patient adamantly declines COVID-19 testing.  She also states that the inhalers make her very sick, but she would use 1 if it were in an emergency.  Patient was advised that she should  follow-up with her primary care provider.  Patient states that she does not currently have a primary care provider because she had to fire the previous one for charging her for visits that she did not have.  Either way, she was advised that she will need to follow-up with urgent care, a new primary care provider, or return to the emergency department in 3 to 4 weeks to have a repeat chest x-ray to ensure that infiltrates have started to resolve.  She was also encouraged to return to the emergency department for symptoms of change or worsen if she is not able to get an appointment right away.    ___________________________________________   FINAL CLINICAL IMPRESSION(S) / ED DIAGNOSES  Final diagnoses:  Community acquired pneumonia of left lower lobe of lung  Community acquired pneumonia of right lower lobe of lung     ED Discharge Orders         Ordered    azithromycin (ZITHROMAX) 250 MG tablet        09/01/20 1406    albuterol (VENTOLIN HFA) 108 (90 Base) MCG/ACT inhaler  Every 4 hours PRN        09/01/20 1406    predniSONE (DELTASONE) 10 MG tablet  Daily        09/01/20 1406           Schyler E Koehler was evaluated in Emergency Department on 09/01/2020 for the symptoms described in the history of present illness. She was evaluated in the context of the global COVID-19 pandemic, which necessitated consideration that the patient might be at risk for infection with the SARS-CoV-2 virus that causes COVID-19. Institutional protocols and algorithms that pertain to the evaluation of patients at risk for COVID-19 are in a state of rapid change based on information released by regulatory bodies including the CDC and federal and state organizations. These policies and algorithms were followed during the patient's care in the ED.   Note:  This document was prepared using Dragon voice recognition software and may include unintentional dictation errors.   Chinita Pester, FNP 09/01/20 1408      Sharyn Creamer, MD 09/02/20 1459

## 2020-09-01 NOTE — ED Notes (Signed)
Pt with c/o SOB with exertion, states she has asthma but does not like to use inhalers. Pt with dry cough x several days.

## 2020-09-01 NOTE — ED Triage Notes (Signed)
Patient to ED for shob, fatigue and cough. States she has asthma. Also mentions for the last couple of days her urine is a dark orange color. Admits to dysuria. Denies urgency.

## 2020-09-15 ENCOUNTER — Other Ambulatory Visit: Payer: Self-pay

## 2020-09-15 ENCOUNTER — Emergency Department
Admission: EM | Admit: 2020-09-15 | Discharge: 2020-09-15 | Disposition: A | Discharge status: Home / Self Care | Payer: BC Managed Care – PPO | Attending: Emergency Medicine | Admitting: Emergency Medicine

## 2020-09-15 ENCOUNTER — Emergency Department: Payer: BC Managed Care – PPO

## 2020-09-15 ENCOUNTER — Encounter: Payer: Self-pay | Admitting: Emergency Medicine

## 2020-09-15 DIAGNOSIS — Z79899 Other long term (current) drug therapy: Secondary | ICD-10-CM | POA: Diagnosis not present

## 2020-09-15 DIAGNOSIS — Z0000 Encounter for general adult medical examination without abnormal findings: Secondary | ICD-10-CM | POA: Diagnosis not present

## 2020-09-15 DIAGNOSIS — I10 Essential (primary) hypertension: Secondary | ICD-10-CM | POA: Diagnosis not present

## 2020-09-15 DIAGNOSIS — Z96619 Presence of unspecified artificial shoulder joint: Secondary | ICD-10-CM | POA: Diagnosis not present

## 2020-09-15 DIAGNOSIS — J45909 Unspecified asthma, uncomplicated: Secondary | ICD-10-CM | POA: Diagnosis not present

## 2020-09-15 DIAGNOSIS — Z96649 Presence of unspecified artificial hip joint: Secondary | ICD-10-CM | POA: Insufficient documentation

## 2020-09-15 DIAGNOSIS — Z87891 Personal history of nicotine dependence: Secondary | ICD-10-CM | POA: Diagnosis not present

## 2020-09-15 DIAGNOSIS — R918 Other nonspecific abnormal finding of lung field: Secondary | ICD-10-CM | POA: Diagnosis present

## 2020-09-15 NOTE — ED Triage Notes (Signed)
Pt states here for follow up chest x-ray, was here on 09/01/20, dx with pneumonia, states was told to have follow up chest x-ray but currently does not have PCP so she returned to ED for follow up chest x-ray.   Pt states "I feel great, no problems breathing".

## 2020-09-15 NOTE — ED Provider Notes (Signed)
Emergency Department Provider Note  ____________________________________________  Time seen: Approximately 3:56 PM  I have reviewed the triage vital signs and the nursing notes.   HISTORY  Chief Complaint Follow-up   Historian Patient     HPI Elaine Harmon is a 69 y.o. female presents to the emergency department for a repeat chest x-ray.  Patient states that she was diagnosed with community-acquired pneumonia on 09/01/2020.  She states that she took medications as directed and provider emphasized the importance of coming back for repeat chest x-ray.  Patient is completely asymptomatic at this time.  She denies fever, shortness of breath or cough.   Past Medical History:  Diagnosis Date  . Arthritis   . Asthma   . History of kidney stones   . Hyperlipidemia   . Hypertension      Immunizations up to date:  Yes.     Past Medical History:  Diagnosis Date  . Arthritis   . Asthma   . History of kidney stones   . Hyperlipidemia   . Hypertension     Patient Active Problem List   Diagnosis Date Noted  . Chest wall pain 11/21/2013    Past Surgical History:  Procedure Laterality Date  . CATARACT EXTRACTION W/PHACO Right 04/22/2017   Procedure: CATARACT EXTRACTION PHACO AND INTRAOCULAR LENS PLACEMENT (IOC) INSERTION OF I-STENT;  Surgeon: Nevada Crane, MD;  Location: [facility] ORS;  Service: Ophthalmology;  Laterality: Right;  [place] 1:35.5 AP% 17.2 CDE 17.29 FLUID PACK LOT# [id number]  . CATARACT EXTRACTION W/PHACO Left 05/06/2017   Procedure: CATARACT EXTRACTION PHACO AND INTRAOCULAR LENS PLACEMENT (IOC) / ISTENT;  Surgeon: Nevada Crane, MD;  Location: [facility] ORS;  Service: Ophthalmology;  Laterality: Left;  [place] 00:51.9 AP%17.2 CDE 8.93 Fluid Pack lot # [id number]  . COLONOSCOPY  2014  . EYE SURGERY    . JOINT REPLACEMENT  05/23/13   hip replacement / SHOULDER  . kidney tube resection  1996    Prior to Admission medications   Medication Sig Start Date End Date  Taking? Authorizing Provider  albuterol (VENTOLIN HFA) 108 (90 Base) MCG/ACT inhaler Inhale 2 puffs into the lungs every 4 (four) hours as needed for wheezing or shortness of breath. 09/01/20   Triplett, Rulon Eisenmenger B, FNP  azithromycin (ZITHROMAX) 250 MG tablet 2 tablets today, then 1 tablet for the next 4 days. 09/01/20   Triplett, Cari B, FNP  KRILL OIL PO Take by mouth.    [provider]  latanoprost (XALATAN) 0.005 % ophthalmic solution Place 1 drop into both eyes at bedtime.    [provider]  lisinopril (PRINIVIL,ZESTRIL) 5 MG tablet Take 5 mg by mouth daily.    [provider]  predniSONE (DELTASONE) 10 MG tablet Take 5 tablets (50 mg total) by mouth daily. 09/01/20   Triplett, Rulon Eisenmenger B, FNP    Allergies Sulfa antibiotics  Family History  Problem Relation Age of Onset  . Breast cancer Maternal Grandmother     Social History Social History   Tobacco Use  . Smoking status: Former Smoker    Types: Cigarettes  . Smokeless tobacco: Never Used  Substance Use Topics  . Alcohol use: No  . Drug use: No     Review of Systems  Constitutional: No fever/chills Eyes:  No discharge ENT: No upper respiratory complaints. Respiratory: no cough. No SOB/ use of accessory muscles to breath Gastrointestinal:   No nausea, no vomiting.  No diarrhea.  No constipation. Musculoskeletal: Negative for musculoskeletal pain. Skin:  Negative for rash, abrasions, lacerations, ecchymosis.    ____________________________________________   PHYSICAL EXAM:  VITAL SIGNS: ED Triage Vitals [09/15/20 1417]  Enc Vitals Group     BP (!) 138/95     Pulse Rate 70     Resp 20     Temp 98.2 F (36.8 C)     Temp Source Oral     SpO2 97 %     Weight 190 lb (86.2 kg)     Height 5\' 2"  (1.575 m)     Head Circumference      Peak Flow      Pain Score 0     Pain Loc      Pain Edu?      Excl. in GC?      Constitutional: Alert and oriented. Well appearing and in no acute  distress. Eyes: Conjunctivae are normal. PERRL. EOMI. Head: Atraumatic. ENT:      Nose: No congestion/rhinnorhea.      Mouth/Throat: Mucous membranes are moist.  Neck: No stridor.  No cervical spine tenderness to palpation. Cardiovascular: Normal rate, regular rhythm. Normal S1 and S2.  Good peripheral circulation. Respiratory: Normal respiratory effort without tachypnea or retractions. Lungs CTAB. Good air entry to the bases with no decreased or absent breath sounds Gastrointestinal: Bowel sounds x 4 quadrants. Soft and nontender to palpation. No guarding or rigidity. No distention. Musculoskeletal: Full range of motion to all extremities. No obvious deformities noted Neurologic:  Normal for age. No gross focal neurologic deficits are appreciated.  Skin:  Skin is warm, dry and intact. No rash noted. Psychiatric: Mood and affect are normal for age. Speech and behavior are normal.   ____________________________________________   LABS (all labs ordered are listed, but only abnormal results are displayed)  Labs Reviewed - No data to display ____________________________________________  EKG   ____________________________________________  RADIOLOGY   DG Chest 2 View  Result Date: 09/15/2020 CLINICAL DATA:  Follow up pneumonia diagnosed 2 weeks ago. No current complaints. EXAM: CHEST - 2 VIEW COMPARISON:  Radiographs 09/01/2020 and 07/06/2009. FINDINGS: The heart size and mediastinal contours are stable without evidence of adenopathy. There has been partial, but incomplete clearing of the nodular airspace and ground-glass opacities in both lungs, consistent with resolving pneumonia. No new airspace disease, pleural effusion or pneumothorax. There are degenerative changes throughout the thoracic spine. Previous bilateral shoulder reverse arthroplasty. IMPRESSION: Partial, but incomplete clearing of bilateral infiltrates consistent with resolving pneumonia. Suggest additional follow-up in  2-3 weeks to document complete resolution. Electronically Signed   By: 07/08/2009 M.D.   On: 09/15/2020 16:56    ____________________________________________    PROCEDURES  Procedure(s) performed:     Procedures     Medications - No data to display   ____________________________________________   INITIAL IMPRESSION / ASSESSMENT AND PLAN / ED COURSE  Pertinent labs & imaging results that were available during my care of the patient were reviewed by me and considered in my medical decision making (see chart for details).      Assessment and Plan: Reevaluation 69 year old female presents to the emergency department for repeat chest x-ray after being diagnosed with community-acquired pneumonia.  Vital signs are reassuring at triage.  On physical exam, patient was resting comfortably with no adventitious lung sounds.  Patient elected to have repeat chest x-ray conducted in the emergency department.  Chest x-ray indicated partial clearance of previously visualized infiltrates.  Reassurance was given.  All patient questions were answered.   ____________________________________________  FINAL CLINICAL  IMPRESSION(S) / ED DIAGNOSES  Final diagnoses:  Evaluation by medical service required      NEW MEDICATIONS STARTED DURING THIS VISIT:  ED Discharge Orders    None          This chart was dictated using voice recognition software/Dragon. Despite best efforts to proofread, errors can occur which can change the meaning. Any change was purely unintentional.     Gasper Lloyd 09/15/20 2056    Jene Every, MD 09/15/20 2107

## 2020-12-30 ENCOUNTER — Other Ambulatory Visit: Payer: Self-pay | Admitting: Family Medicine

## 2020-12-30 DIAGNOSIS — Z1231 Encounter for screening mammogram for malignant neoplasm of breast: Secondary | ICD-10-CM

## 2021-10-25 IMAGING — CR DG CHEST 2V
1 series · 2 of 2 positions shown · non-contrast
Comparison: Radiographs 09/01/2020 and 07/06/2009.

CLINICAL DATA: Follow up pneumonia diagnosed 2 weeks ago. No
current complaints.

EXAM:
CHEST - 2 VIEW

[Series 1: dg chest 2 view · 0.14mm/px · 2 of 2 slices shown]
[im 1/2]
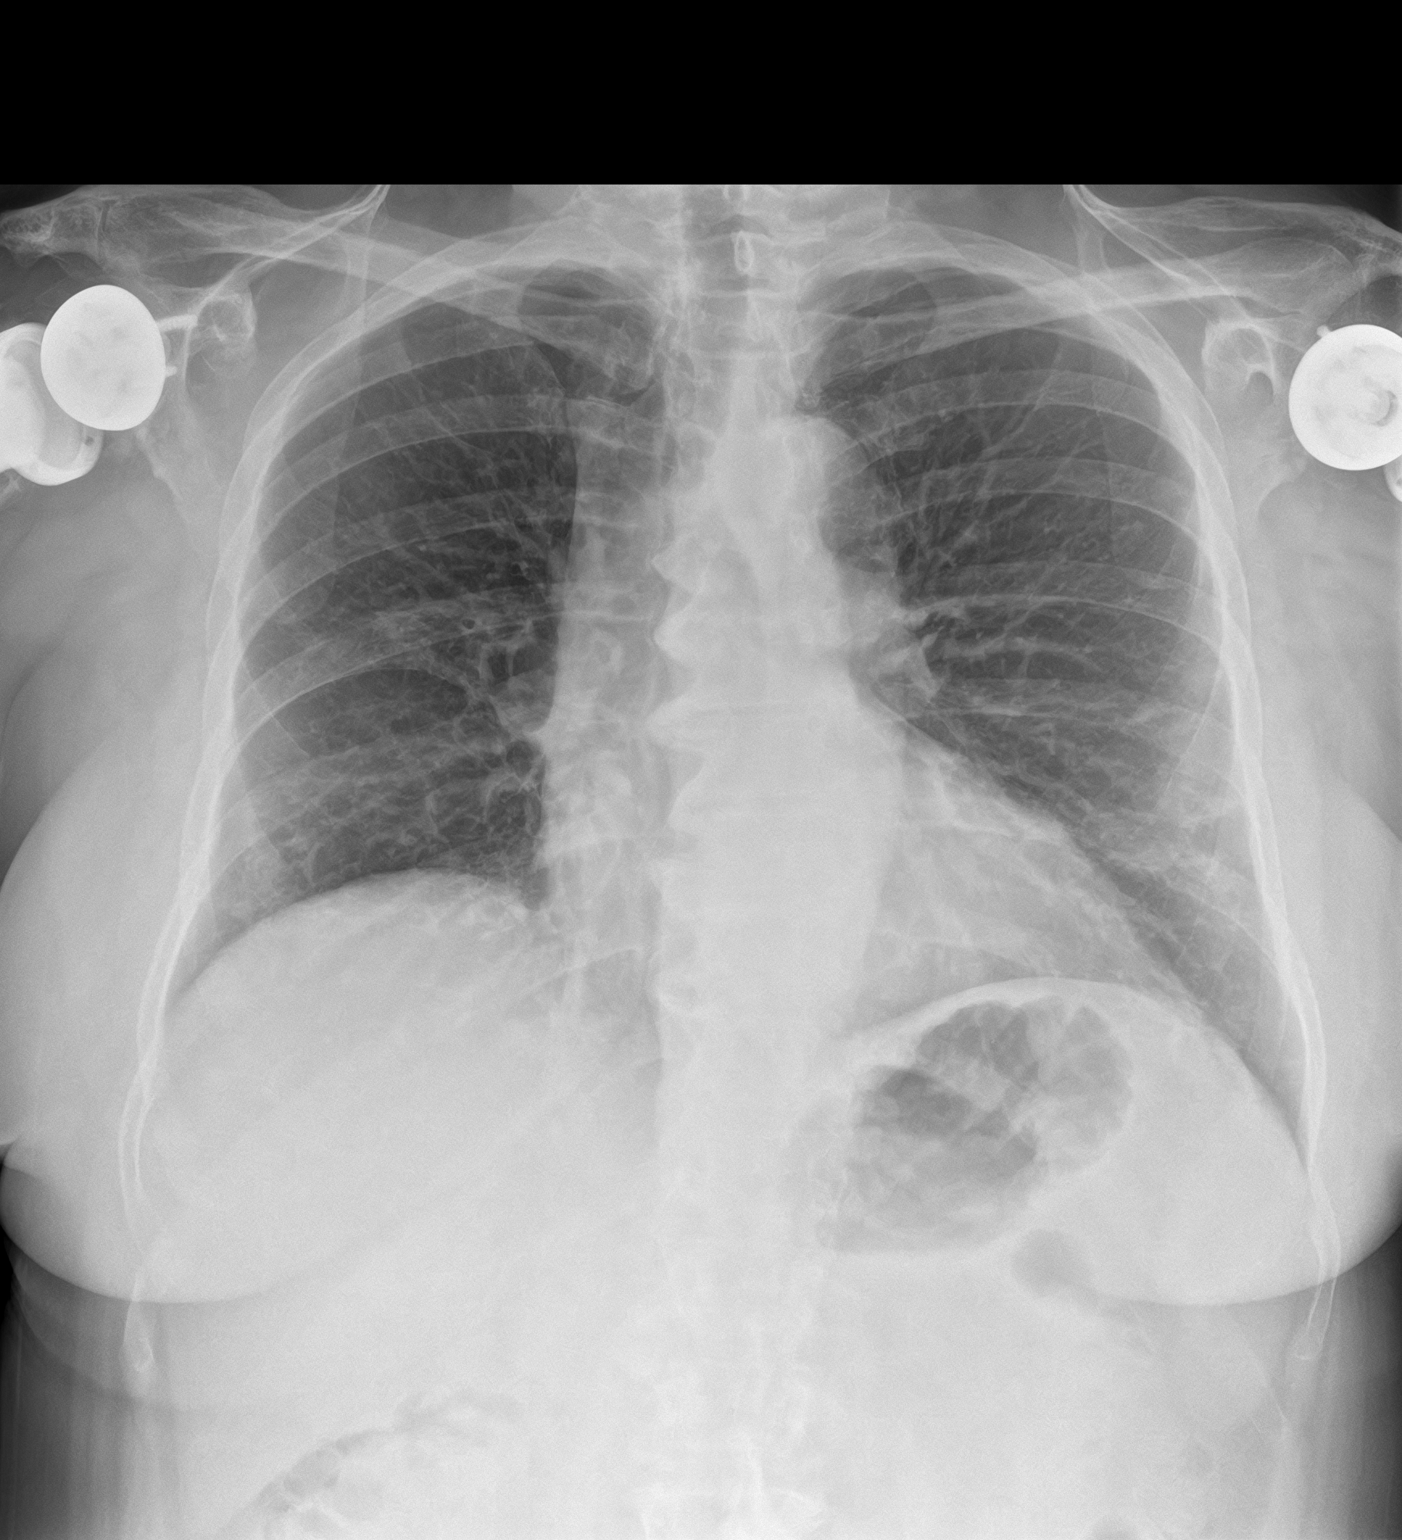
[im 2/2]
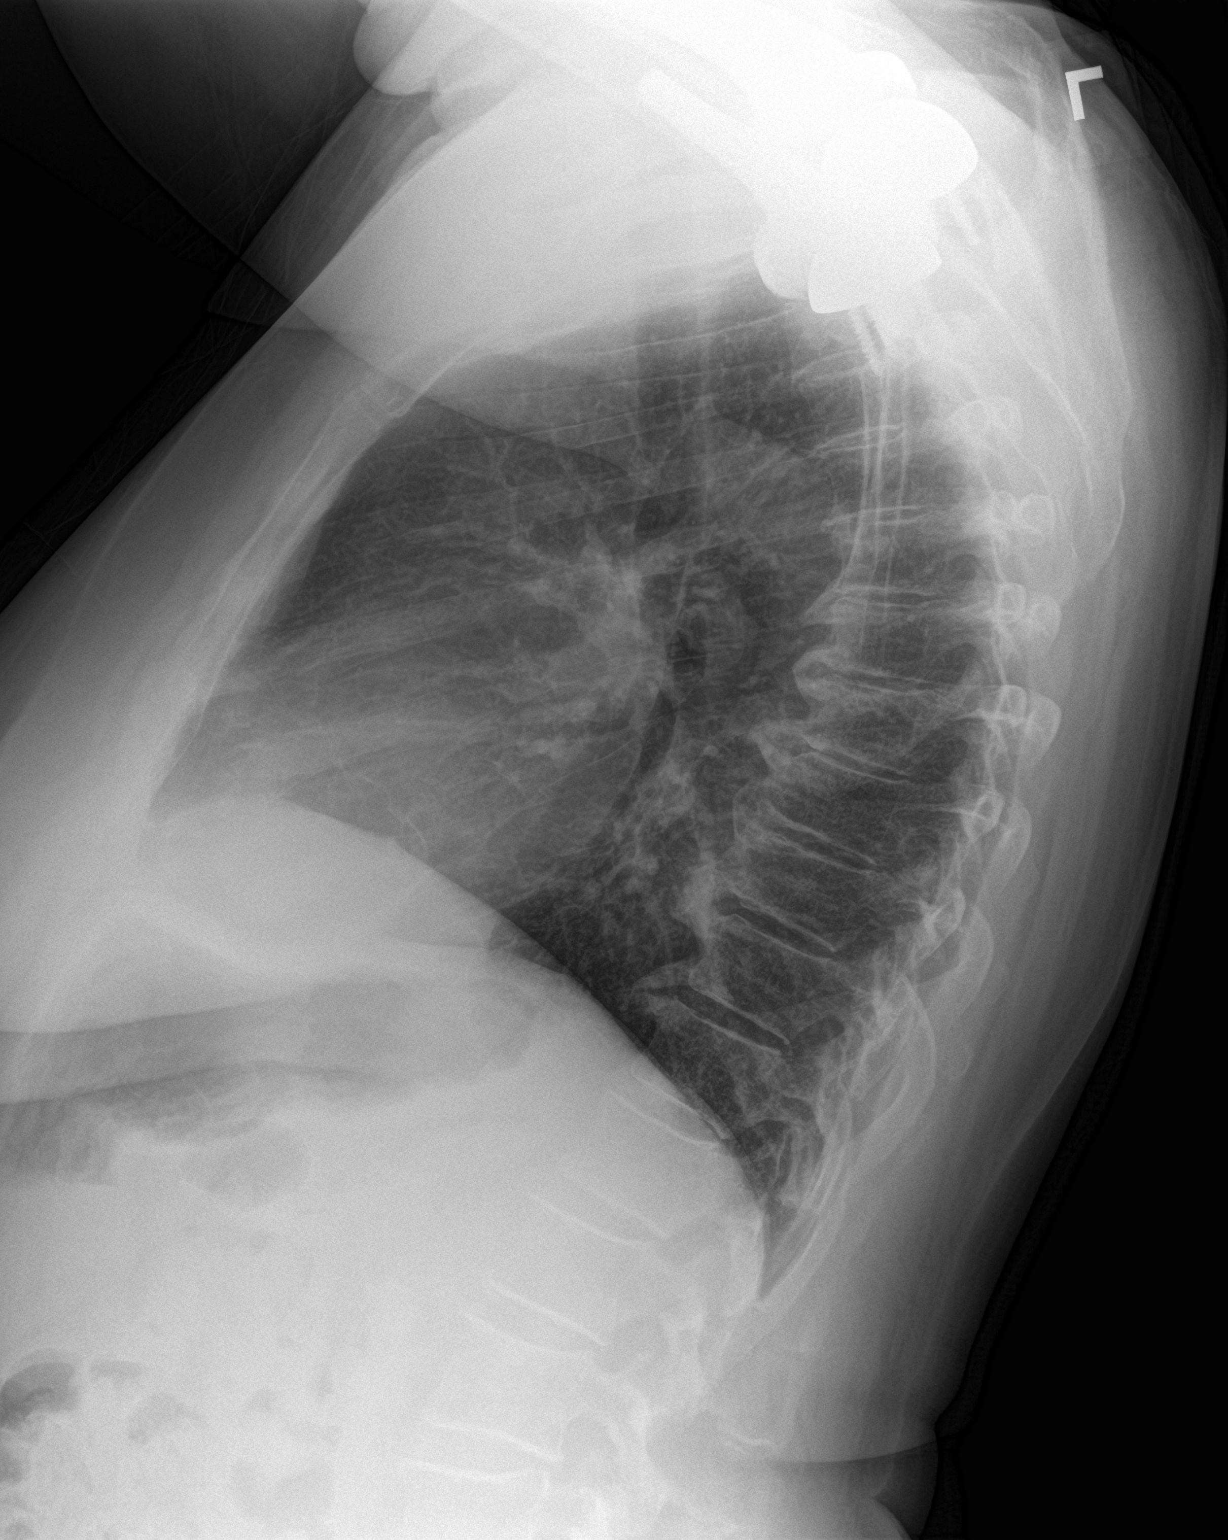

[2 of 2 positions shown; findings below may reference images not displayed]

FINDINGS: The heart size and mediastinal contours are stable without evidence
of adenopathy. There has been partial, but incomplete clearing of
the nodular airspace and ground-glass opacities in both lungs,
consistent with resolving pneumonia. No new airspace disease,
pleural effusion or pneumothorax.

There are degenerative changes throughout the thoracic spine.
Previous bilateral shoulder reverse arthroplasty.
IMPRESSION: Partial, but incomplete clearing of bilateral infiltrates consistent
with resolving pneumonia. Suggest additional follow-up in 2-3 weeks
to document complete resolution.

## 2022-01-13 ENCOUNTER — Other Ambulatory Visit: Payer: Self-pay | Admitting: Family Medicine

## 2022-01-13 DIAGNOSIS — Z1231 Encounter for screening mammogram for malignant neoplasm of breast: Secondary | ICD-10-CM

## 2022-01-15 ENCOUNTER — Ambulatory Visit
Admission: RE | Admit: 2022-01-15 | Discharge: 2022-01-15 | Disposition: A | Discharge status: Home / Self Care | Payer: Medicare Other | Source: Ambulatory Visit | Attending: Family Medicine | Admitting: Family Medicine

## 2022-01-15 ENCOUNTER — Other Ambulatory Visit: Payer: Self-pay

## 2022-01-15 DIAGNOSIS — Z1231 Encounter for screening mammogram for malignant neoplasm of breast: Secondary | ICD-10-CM | POA: Insufficient documentation

## 2022-03-12 DIAGNOSIS — F4001 Agoraphobia with panic disorder: Secondary | ICD-10-CM | POA: Diagnosis not present

## 2022-03-12 DIAGNOSIS — F411 Generalized anxiety disorder: Secondary | ICD-10-CM | POA: Diagnosis not present

## 2022-03-12 DIAGNOSIS — Z780 Asymptomatic menopausal state: Secondary | ICD-10-CM | POA: Diagnosis not present

## 2022-03-16 DIAGNOSIS — F5105 Insomnia due to other mental disorder: Secondary | ICD-10-CM | POA: Diagnosis not present

## 2022-03-16 DIAGNOSIS — F4322 Adjustment disorder with anxiety: Secondary | ICD-10-CM | POA: Diagnosis not present

## 2022-03-16 DIAGNOSIS — F4312 Post-traumatic stress disorder, chronic: Secondary | ICD-10-CM | POA: Diagnosis not present

## 2022-03-16 DIAGNOSIS — F4001 Agoraphobia with panic disorder: Secondary | ICD-10-CM | POA: Diagnosis not present

## 2022-03-26 DIAGNOSIS — M8588 Other specified disorders of bone density and structure, other site: Secondary | ICD-10-CM | POA: Diagnosis not present

## 2022-04-07 DIAGNOSIS — F4001 Agoraphobia with panic disorder: Secondary | ICD-10-CM | POA: Diagnosis not present

## 2022-04-07 DIAGNOSIS — F5105 Insomnia due to other mental disorder: Secondary | ICD-10-CM | POA: Diagnosis not present

## 2022-04-07 DIAGNOSIS — F4312 Post-traumatic stress disorder, chronic: Secondary | ICD-10-CM | POA: Diagnosis not present

## 2022-04-10 DIAGNOSIS — F411 Generalized anxiety disorder: Secondary | ICD-10-CM | POA: Diagnosis not present

## 2022-04-10 DIAGNOSIS — F4001 Agoraphobia with panic disorder: Secondary | ICD-10-CM | POA: Diagnosis not present

## 2022-05-15 DIAGNOSIS — F4312 Post-traumatic stress disorder, chronic: Secondary | ICD-10-CM | POA: Diagnosis not present

## 2022-05-15 DIAGNOSIS — F4001 Agoraphobia with panic disorder: Secondary | ICD-10-CM | POA: Diagnosis not present

## 2022-05-15 DIAGNOSIS — F4322 Adjustment disorder with anxiety: Secondary | ICD-10-CM | POA: Diagnosis not present

## 2022-05-15 DIAGNOSIS — F5105 Insomnia due to other mental disorder: Secondary | ICD-10-CM | POA: Diagnosis not present

## 2022-06-15 ENCOUNTER — Emergency Department: Payer: Medicare HMO

## 2022-06-15 ENCOUNTER — Other Ambulatory Visit: Payer: Self-pay

## 2022-06-15 DIAGNOSIS — D72829 Elevated white blood cell count, unspecified: Secondary | ICD-10-CM | POA: Diagnosis not present

## 2022-06-15 DIAGNOSIS — R1084 Generalized abdominal pain: Secondary | ICD-10-CM | POA: Insufficient documentation

## 2022-06-15 DIAGNOSIS — R197 Diarrhea, unspecified: Secondary | ICD-10-CM | POA: Insufficient documentation

## 2022-06-15 DIAGNOSIS — R1032 Left lower quadrant pain: Secondary | ICD-10-CM | POA: Diagnosis not present

## 2022-06-15 DIAGNOSIS — R1030 Lower abdominal pain, unspecified: Secondary | ICD-10-CM | POA: Diagnosis present

## 2022-06-15 DIAGNOSIS — R112 Nausea with vomiting, unspecified: Secondary | ICD-10-CM | POA: Diagnosis not present

## 2022-06-15 DIAGNOSIS — N281 Cyst of kidney, acquired: Secondary | ICD-10-CM | POA: Diagnosis not present

## 2022-06-15 LAB — COMPREHENSIVE METABOLIC PANEL
ALT: 19 U/L (ref 0–44)
AST: 27 U/L (ref 15–41)
Albumin: 3.8 g/dL (ref 3.5–5.0)
Alkaline Phosphatase: 77 U/L (ref 38–126)
Anion gap: 12 (ref 5–15)
BUN: 21 mg/dL (ref 8–23)
CO2: 25 mmol/L (ref 22–32)
Calcium: 9.4 mg/dL (ref 8.9–10.3)
Chloride: 105 mmol/L (ref 98–111)
Creatinine, Ser: 1.06 mg/dL — ABNORMAL HIGH (ref 0.44–1.00)
GFR, Estimated: 56 mL/min — ABNORMAL LOW (ref >60)
Glucose, Bld: 228 mg/dL — ABNORMAL HIGH (ref 70–99)
Potassium: 4.1 mmol/L (ref 3.5–5.1)
Sodium: 142 mmol/L (ref 135–145)
Total Bilirubin: 0.8 mg/dL (ref 0.3–1.2)
Total Protein: 7.3 g/dL (ref 6.5–8.1)

## 2022-06-15 LAB — URINALYSIS, ROUTINE W REFLEX MICROSCOPIC
Bacteria, UA: NONE SEEN
Bilirubin Urine: NEGATIVE
Glucose, UA: 150 mg/dL — AB
Hgb urine dipstick: NEGATIVE
Ketones, ur: 20 mg/dL — AB
Nitrite: NEGATIVE
Protein, ur: NEGATIVE mg/dL
Specific Gravity, Urine: 1.027 (ref 1.005–1.030)
pH: 5 (ref 5.0–8.0)

## 2022-06-15 LAB — CBC WITH DIFFERENTIAL/PLATELET
Abs Immature Granulocytes: 0.05 10*3/uL (ref 0.00–0.07)
Basophils Absolute: 0.1 10*3/uL (ref 0.0–0.1)
Basophils Relative: 1 %
Eosinophils Absolute: 0.1 10*3/uL (ref 0.0–0.5)
Eosinophils Relative: 1 %
HCT: 41.4 % (ref 36.0–46.0)
Hemoglobin: 13.3 g/dL (ref 12.0–15.0)
Immature Granulocytes: 0 %
Lymphocytes Relative: 13 %
Lymphs Abs: 1.5 10*3/uL (ref 0.7–4.0)
MCH: 29.5 pg (ref 26.0–34.0)
MCHC: 32.1 g/dL (ref 30.0–36.0)
MCV: 91.8 fL (ref 80.0–100.0)
Monocytes Absolute: 0.4 10*3/uL (ref 0.1–1.0)
Monocytes Relative: 4 %
Neutro Abs: 9.8 10*3/uL — ABNORMAL HIGH (ref 1.7–7.7)
Neutrophils Relative %: 81 %
Platelets: 280 10*3/uL (ref 150–400)
RBC: 4.51 MIL/uL (ref 3.87–5.11)
RDW: 12.4 % (ref 11.5–15.5)
WBC: 11.9 10*3/uL — ABNORMAL HIGH (ref 4.0–10.5)
nRBC: 0 % (ref 0.0–0.2)

## 2022-06-15 LAB — LIPASE, BLOOD: Lipase: 33 U/L (ref 11–51)

## 2022-06-15 MED ORDER — IOHEXOL 300 MG/ML  SOLN
100.0000 mL | Freq: Once | INTRAMUSCULAR | Status: AC | PRN
  Administered 2022-06-15: 100 mL via INTRAVENOUS

## 2022-06-15 NOTE — ED Provider Triage Note (Signed)
Emergency Medicine Provider Triage Evaluation Note  Elaine Harmon , a 71 y.o. female  was evaluated in triage.  Pt complains of lower abdominal pain, diarrhea.  Patient presents to the ED with sharp lower abdominal pain started roughly 3:00 this afternoon.  Does have a history of diverticulosis/diverticulitis.  No urinary changes.  No abdominal surgeries.  Review of Systems  Positive: Lower abdominal pain, diarrhea Negative: Fever, nausea, vomiting, urinary changes  Physical Exam  BP 100/83   Pulse 85   Temp 98.3 F (36.8 C) (Oral)   Resp 15   Wt 88.5 kg   SpO2 100%   BMI 35.67 kg/m  Gen:   Awake, no distress   Resp:  Normal effort  MSK:   Moves extremities without difficulty  Other:  Bowel sounds present.  Tender in the periumbilical, suprapubic and left lower quadrant  Medical Decision Making  Medically screening exam initiated at 8:57 PM.  Appropriate orders placed.  FLOETTA BRICKEY was informed that the remainder of the evaluation will be completed by another provider, this initial triage assessment does not replace that evaluation, and the importance of remaining in the ED until their evaluation is complete.  Patient presents with left lower, lower abdominal pain.  Has a history of diverticulitis/diverticulosis.  Will order labs, CT scan at this time.   Racheal Patches, PA-C 06/15/22 2057

## 2022-06-15 NOTE — ED Triage Notes (Signed)
Pt arrives with c/o lower ABD pain that started this afternoon. Pt denies n/v. Pt had a BM around 8pm without relief of the pain.

## 2022-06-16 ENCOUNTER — Emergency Department
Admission: EM | Admit: 2022-06-16 | Discharge: 2022-06-16 | Disposition: A | Discharge status: Home / Self Care | Payer: Medicare HMO | Attending: Emergency Medicine | Admitting: Emergency Medicine

## 2022-06-16 DIAGNOSIS — R1084 Generalized abdominal pain: Secondary | ICD-10-CM

## 2022-06-16 DIAGNOSIS — R197 Diarrhea, unspecified: Secondary | ICD-10-CM

## 2022-06-16 NOTE — ED Provider Notes (Signed)
[facility] Provider Note    Event Date/Time   First MD Initiated Contact with Patient 06/16/22 0143     (approximate)   History   Abdominal Pain   HPI  GWENDOLA HORNADAY is a 71 y.o. female who presents for evaluation of lower abdominal pain starting earlier this afternoon.  She has not had any nausea or vomiting but she has had several episodes of diarrhea.  It is accompanied with a sense of bowel urgency and some mild cramping.  However she feels much better after coming to the emergency department.  She has been waiting for a long time for bed due to overwhelming ED patient volume, and she says she feels much better now than she did earlier.  She has not had any fever or chills, no chest pain or shortness of breath.  No other recent symptoms.  No specific bad food ingestion or abnormal food intake that she can think of.  No recent urinary symptoms.  No recent medication changes.     Physical Exam   Triage Vital Signs: ED Triage Vitals  Enc Vitals Group     BP 06/15/22 2053 100/83     Pulse Rate 06/15/22 2053 85     Resp 06/15/22 2053 15     Temp 06/15/22 2053 98.3 F (36.8 C)     Temp Source 06/15/22 2053 Oral     SpO2 06/15/22 2053 100 %     Weight 06/15/22 2054 88.5 kg (195 lb)     Height --      Head Circumference --      Peak Flow --      Pain Score --      Pain Loc --      Pain Edu? --      Excl. in GC? --     Most recent vital signs: Vitals:   06/15/22 2053  BP: 100/83  Pulse: 85  Resp: 15  Temp: 98.3 F (36.8 C)  SpO2: 100%     General: Awake, no distress.  CV:  Good peripheral perfusion.  Heart sounds are normal. Resp:  Normal effort.  Lungs are clear to auscultation bilaterally. Abd:  No distention.  No tenderness to palpation throughout the abdomen.  No rebound and no guarding. Other:  Normal mentation, cheerful, well-appearing.   ED Results / Procedures / Treatments   Labs (all labs ordered are listed, but only  abnormal results are displayed) Labs Reviewed  COMPREHENSIVE METABOLIC PANEL - Abnormal; Notable for the following components:      Result Value   Glucose, Bld 228 (*)    Creatinine, Ser 1.06 (*)    GFR, Estimated 56 (*)    All other components within normal limits  CBC WITH DIFFERENTIAL/PLATELET - Abnormal; Notable for the following components:   WBC 11.9 (*)    Neutro Abs 9.8 (*)    All other components within normal limits  URINALYSIS, ROUTINE W REFLEX MICROSCOPIC - Abnormal; Notable for the following components:   Color, Urine YELLOW (*)    APPearance HAZY (*)    Glucose, UA 150 (*)    Ketones, ur 20 (*)    Leukocytes,Ua TRACE (*)    All other components within normal limits  LIPASE, BLOOD     RADIOLOGY I viewed and interpreted the patient's CT of the abdomen and pelvis.  I do not identify any acute abnormality or evidence of infection.  I also read the radiologist's report, which confirmed no acute  findings.    PROCEDURES:  Critical Care performed: No  Procedures   MEDICATIONS ORDERED IN ED: Medications  iohexol (OMNIPAQUE) 300 MG/ML solution 100 mL (100 mLs Intravenous Contrast Given 06/15/22 2201)     IMPRESSION / MDM / ASSESSMENT AND PLAN / ED COURSE  I reviewed the triage vital signs and the nursing notes.                              Differential diagnosis includes, but is not limited to, diverticulitis, appendicitis, UTI/pyelonephritis, biliary disease, viral infection, colitis.  Patient's presentation is most consistent with acute presentation with potential threat to life or bodily function.  However, the patient's evaluation and work-up has been reassuring.  Vital signs have been stable throughout.  Labs ordered initially include CBC with differential, comprehensive metabolic panel, lipase, and urinalysis..  Labs are notable for very mild leukocytosis of 11.9 which is nonspecific.  Comprehensive metabolic panel is essentially normal.  Urinalysis shows  no evidence of infection.  Lipase is normal.  CT scan of the abdomen and pelvis was ordered given the patient's report of abdominal pain.  As documented above, no specific acute abnormality was identified, just some mild pelvic free fluid which is likely reactive.  Patient feels much better now that she did earlier and is in no distress with no tenderness to palpation.  No indication for further evaluation.  Given the patient's age and initial presenting complaint, I was concerned she might require hospitalization which I considered, but given that her symptoms have almost completely resolved and she has a reassuring work-up, she should be appropriate for discharge and outpatient follow-up.  I gave my usual customary abdominal pain management recommendations and follow-up recommendations, and she understands and agrees with the plan.      FINAL CLINICAL IMPRESSION(S) / ED DIAGNOSES   Final diagnoses:  Generalized abdominal pain  Diarrhea, unspecified type     Rx / DC Orders   ED Discharge Orders     None        Note:  This document was prepared using Dragon voice recognition software and may include unintentional dictation errors.   Loleta Rose, MD 06/16/22 [phone]

## 2022-06-16 NOTE — Discharge Instructions (Signed)

## 2022-08-12 DIAGNOSIS — F4312 Post-traumatic stress disorder, chronic: Secondary | ICD-10-CM | POA: Diagnosis not present

## 2022-08-12 DIAGNOSIS — F4001 Agoraphobia with panic disorder: Secondary | ICD-10-CM | POA: Diagnosis not present

## 2022-08-12 DIAGNOSIS — F4322 Adjustment disorder with anxiety: Secondary | ICD-10-CM | POA: Diagnosis not present

## 2022-08-12 DIAGNOSIS — F5105 Insomnia due to other mental disorder: Secondary | ICD-10-CM | POA: Diagnosis not present

## 2022-09-15 ENCOUNTER — Emergency Department: Payer: Medicare HMO

## 2022-09-15 ENCOUNTER — Emergency Department
Admission: EM | Admit: 2022-09-15 | Discharge: 2022-09-15 | Disposition: A | Discharge status: Home / Self Care | Payer: Medicare HMO | Attending: Emergency Medicine | Admitting: Emergency Medicine

## 2022-09-15 ENCOUNTER — Other Ambulatory Visit: Payer: Self-pay

## 2022-09-15 ENCOUNTER — Encounter: Payer: Self-pay | Admitting: Medical Oncology

## 2022-09-15 DIAGNOSIS — W010XXA Fall on same level from slipping, tripping and stumbling without subsequent striking against object, initial encounter: Secondary | ICD-10-CM | POA: Insufficient documentation

## 2022-09-15 DIAGNOSIS — S52502A Unspecified fracture of the lower end of left radius, initial encounter for closed fracture: Secondary | ICD-10-CM | POA: Insufficient documentation

## 2022-09-15 DIAGNOSIS — S6992XA Unspecified injury of left wrist, hand and finger(s), initial encounter: Secondary | ICD-10-CM | POA: Diagnosis not present

## 2022-09-15 MED ORDER — ACETAMINOPHEN 325 MG PO TABS
650.0000 mg | ORAL_TABLET | Freq: Once | ORAL | Status: AC
  Administered 2022-09-15: 650 mg via ORAL
  Filled 2022-09-15: qty 2

## 2022-09-15 NOTE — ED Provider Notes (Signed)
[facility] Provider Note    Event Date/Time   First MD Initiated Contact with Patient 09/15/22 1647     (approximate)   History   Wrist Pain   HPI  Elaine Harmon is a 71 y.o. female who presents today for evaluation of left wrist injury.  Patient reports that she was cleaning her house and she did not realize that there was water on the floor and she slipped and landed on her left outstretched hand.  There was no head strike or LOC.  She was able to get herself up and drive herself here.  She reports that she has pain in her left wrist only.  No nausea or vomiting.  No anticoagulation.  No paresthesias.  Patient Active Problem List   Diagnosis Date Noted   Chest wall pain 11/21/2013          Physical Exam   Triage Vital Signs: ED Triage Vitals  Enc Vitals Group     BP 09/15/22 1342 (!) 158/74     Pulse Rate 09/15/22 1342 84     Resp 09/15/22 1342 (!) 22     Temp 09/15/22 1342 98.2 F (36.8 C)     Temp Source 09/15/22 1342 Oral     SpO2 09/15/22 1342 94 %     Weight 09/15/22 1338 194 lb 0.1 oz (88 kg)     Height 09/15/22 1338 5\' 2"  (1.093 m)     Head Circumference --      Peak Flow --      Pain Score 09/15/22 1338 7     Pain Loc --      Pain Edu? --      Excl. in [place]? --     Most recent vital signs: Vitals:   09/15/22 1342  BP: (!) 158/74  Pulse: 84  Resp: (!) 22  Temp: 98.2 F (36.8 C)  SpO2: 94%    Physical Exam Vitals and nursing note reviewed.  Constitutional:      General: Awake and alert. No acute distress.    Appearance: Normal appearance. The patient is overweight.  HENT:     Head: Normocephalic and atraumatic.     Mouth: Mucous membranes are moist.  Eyes:     General: PERRL. Normal EOMs        Right eye: No discharge.        Left eye: No discharge.     Conjunctiva/sclera: Conjunctivae normal.  Cardiovascular:     Rate and Rhythm: Normal rate and regular rhythm.     Pulses: Normal pulses.  Pulmonary:      Effort: Pulmonary effort is normal. No respiratory distress.  No chest wall tenderness Abdominal:     Abdomen is soft. There is no abdominal tenderness. No rebound or guarding. No distention. Musculoskeletal:        General: No swelling. Normal range of motion.     Cervical back: Normal range of motion and neck supple.  Left wrist: Tenderness and swelling to the distal radius with mild ecchymosis over the radial styloid.  No tenderness to proximal forearm, elbow, humerus, clavicle, AC joint, or shoulder joint line.  She has full and normal range of motion of her shoulder and elbow.  Normal radial pulse, sensation intact to light touch throughout arm.  Normal intrinsic muscle function of the hand. Pelvis stable.  Normal and full range of motion of bilateral hips, knees, ankles.  Normal distal pulses.   Skin:    General:  Skin is warm and dry.     Capillary Refill: Capillary refill takes less than 2 seconds.     Findings: No rash.  Neurological:     Mental Status: The patient is awake and alert.      ED Results / Procedures / Treatments   Labs (all labs ordered are listed, but only abnormal results are displayed) Labs Reviewed - No data to display   EKG     RADIOLOGY I independently reviewed and interpreted imaging and agree with radiologists findings.     PROCEDURES:  Critical Care performed:   Procedures   MEDICATIONS ORDERED IN ED: Medications  acetaminophen (TYLENOL) tablet 650 mg (650 mg Oral Given 09/15/22 1707)     IMPRESSION / MDM / ASSESSMENT AND PLAN / ED COURSE  I reviewed the triage vital signs and the nursing notes.   Differential diagnosis includes, but is not limited to, fracture, dislocation, contusion, sprain.  Patient is awake and alert, hemodynamically stable and neurovascularly intact.  She has normal intrinsic muscle function of her hand.  X-ray was obtained demonstrating nondisplaced distal radial fracture.  She was placed in a sugar-tong splint.   She was instructed to follow-up with orthopedics and the appropriate information was provided.  She was advised to call tomorrow to make an appointment.  We discussed splint care and return precautions.  She declined analgesia besides Tylenol, does not wish to have any prescriptions.  Patient understands return precautions and the importance of close outpatient follow-up.  She was discharged in stable condition.   Patient's presentation is most consistent with acute complicated illness / injury requiring diagnostic workup.      FINAL CLINICAL IMPRESSION(S) / ED DIAGNOSES   Final diagnoses:  Closed fracture of distal end of left radius, unspecified fracture morphology, initial encounter     Rx / DC Orders   ED Discharge Orders     None        Note:  This document was prepared using Dragon voice recognition software and may include unintentional dictation errors.   Jackelyn Hoehn, PA-C 09/15/22 1752    Georga Hacking, MD 09/15/22 2248

## 2022-09-15 NOTE — ED Notes (Signed)
Pt stated to this tech that she "has panic attacks and wants to tell someone in case things go wrong later." Pt also told this tech that she has "chronic asthma"

## 2022-09-15 NOTE — Discharge Instructions (Signed)
Please follow-up with orthopedics within the week for repeat imaging and casting.  Please call tomorrow to arrange an appointment.  In the meantime, keep your arm elevated to help with swelling.  Please return for any new, worsening, or change in symptoms or other concerns.  It was a pleasure caring for you today.

## 2022-09-15 NOTE — ED Triage Notes (Signed)
Pt reports she slipped and fell earlier and injured left wrist.

## 2022-09-18 DIAGNOSIS — S52502A Unspecified fracture of the lower end of left radius, initial encounter for closed fracture: Secondary | ICD-10-CM | POA: Diagnosis not present

## 2022-09-28 DIAGNOSIS — S52502D Unspecified fracture of the lower end of left radius, subsequent encounter for closed fracture with routine healing: Secondary | ICD-10-CM | POA: Diagnosis not present

## 2022-10-05 DIAGNOSIS — F411 Generalized anxiety disorder: Secondary | ICD-10-CM | POA: Diagnosis not present

## 2022-10-05 DIAGNOSIS — F4001 Agoraphobia with panic disorder: Secondary | ICD-10-CM | POA: Diagnosis not present

## 2022-10-05 DIAGNOSIS — E119 Type 2 diabetes mellitus without complications: Secondary | ICD-10-CM | POA: Diagnosis not present

## 2022-10-12 DIAGNOSIS — F4001 Agoraphobia with panic disorder: Secondary | ICD-10-CM | POA: Diagnosis not present

## 2022-10-12 DIAGNOSIS — F411 Generalized anxiety disorder: Secondary | ICD-10-CM | POA: Diagnosis not present

## 2022-10-12 DIAGNOSIS — Z8781 Personal history of (healed) traumatic fracture: Secondary | ICD-10-CM | POA: Diagnosis not present

## 2022-10-12 DIAGNOSIS — E119 Type 2 diabetes mellitus without complications: Secondary | ICD-10-CM | POA: Diagnosis not present

## 2022-10-14 DIAGNOSIS — R350 Frequency of micturition: Secondary | ICD-10-CM | POA: Diagnosis not present

## 2022-10-14 DIAGNOSIS — R309 Painful micturition, unspecified: Secondary | ICD-10-CM | POA: Diagnosis not present

## 2022-10-19 DIAGNOSIS — S52502D Unspecified fracture of the lower end of left radius, subsequent encounter for closed fracture with routine healing: Secondary | ICD-10-CM | POA: Diagnosis not present

## 2022-11-09 DIAGNOSIS — F4001 Agoraphobia with panic disorder: Secondary | ICD-10-CM | POA: Diagnosis not present

## 2022-11-09 DIAGNOSIS — F4322 Adjustment disorder with anxiety: Secondary | ICD-10-CM | POA: Diagnosis not present

## 2022-11-09 DIAGNOSIS — F5105 Insomnia due to other mental disorder: Secondary | ICD-10-CM | POA: Diagnosis not present

## 2022-11-09 DIAGNOSIS — F4312 Post-traumatic stress disorder, chronic: Secondary | ICD-10-CM | POA: Diagnosis not present

## 2023-01-05 DIAGNOSIS — E119 Type 2 diabetes mellitus without complications: Secondary | ICD-10-CM | POA: Diagnosis not present

## 2023-01-05 DIAGNOSIS — F411 Generalized anxiety disorder: Secondary | ICD-10-CM | POA: Diagnosis not present

## 2023-01-05 DIAGNOSIS — F4001 Agoraphobia with panic disorder: Secondary | ICD-10-CM | POA: Diagnosis not present

## 2023-01-12 DIAGNOSIS — F419 Anxiety disorder, unspecified: Secondary | ICD-10-CM | POA: Diagnosis not present

## 2023-01-12 DIAGNOSIS — L659 Nonscarring hair loss, unspecified: Secondary | ICD-10-CM | POA: Diagnosis not present

## 2023-01-12 DIAGNOSIS — E119 Type 2 diabetes mellitus without complications: Secondary | ICD-10-CM | POA: Diagnosis not present

## 2023-04-13 DIAGNOSIS — L659 Nonscarring hair loss, unspecified: Secondary | ICD-10-CM | POA: Diagnosis not present

## 2023-04-13 DIAGNOSIS — F411 Generalized anxiety disorder: Secondary | ICD-10-CM | POA: Diagnosis not present

## 2023-04-13 DIAGNOSIS — E119 Type 2 diabetes mellitus without complications: Secondary | ICD-10-CM | POA: Diagnosis not present

## 2023-04-20 ENCOUNTER — Other Ambulatory Visit: Payer: Self-pay | Admitting: Family Medicine

## 2023-04-20 DIAGNOSIS — Z0000 Encounter for general adult medical examination without abnormal findings: Secondary | ICD-10-CM | POA: Diagnosis not present

## 2023-04-20 DIAGNOSIS — Z1211 Encounter for screening for malignant neoplasm of colon: Secondary | ICD-10-CM | POA: Diagnosis not present

## 2023-04-20 DIAGNOSIS — Z1231 Encounter for screening mammogram for malignant neoplasm of breast: Secondary | ICD-10-CM | POA: Diagnosis not present

## 2023-06-19 DIAGNOSIS — R051 Acute cough: Secondary | ICD-10-CM | POA: Diagnosis not present

## 2023-06-19 DIAGNOSIS — J45998 Other asthma: Secondary | ICD-10-CM | POA: Diagnosis not present

## 2023-10-14 DIAGNOSIS — E119 Type 2 diabetes mellitus without complications: Secondary | ICD-10-CM | POA: Diagnosis not present

## 2023-10-14 DIAGNOSIS — E782 Mixed hyperlipidemia: Secondary | ICD-10-CM | POA: Diagnosis not present

## 2023-10-21 DIAGNOSIS — E119 Type 2 diabetes mellitus without complications: Secondary | ICD-10-CM | POA: Diagnosis not present

## 2023-10-21 DIAGNOSIS — E7800 Pure hypercholesterolemia, unspecified: Secondary | ICD-10-CM | POA: Diagnosis not present

## 2023-10-21 DIAGNOSIS — J45901 Unspecified asthma with (acute) exacerbation: Secondary | ICD-10-CM | POA: Diagnosis not present

## 2024-01-14 DIAGNOSIS — E119 Type 2 diabetes mellitus without complications: Secondary | ICD-10-CM | POA: Diagnosis not present

## 2024-01-14 DIAGNOSIS — E559 Vitamin D deficiency, unspecified: Secondary | ICD-10-CM | POA: Diagnosis not present

## 2024-01-14 DIAGNOSIS — E782 Mixed hyperlipidemia: Secondary | ICD-10-CM | POA: Diagnosis not present

## 2024-01-21 DIAGNOSIS — J4520 Mild intermittent asthma, uncomplicated: Secondary | ICD-10-CM | POA: Diagnosis not present

## 2024-01-21 DIAGNOSIS — E559 Vitamin D deficiency, unspecified: Secondary | ICD-10-CM | POA: Diagnosis not present

## 2024-01-21 DIAGNOSIS — Z6835 Body mass index (BMI) 35.0-35.9, adult: Secondary | ICD-10-CM | POA: Diagnosis not present

## 2024-01-21 DIAGNOSIS — E782 Mixed hyperlipidemia: Secondary | ICD-10-CM | POA: Diagnosis not present

## 2024-01-21 DIAGNOSIS — E119 Type 2 diabetes mellitus without complications: Secondary | ICD-10-CM | POA: Diagnosis not present

## 2024-04-21 DIAGNOSIS — E782 Mixed hyperlipidemia: Secondary | ICD-10-CM | POA: Diagnosis not present

## 2024-04-21 DIAGNOSIS — J4520 Mild intermittent asthma, uncomplicated: Secondary | ICD-10-CM | POA: Diagnosis not present

## 2024-04-21 DIAGNOSIS — E559 Vitamin D deficiency, unspecified: Secondary | ICD-10-CM | POA: Diagnosis not present

## 2024-04-21 DIAGNOSIS — E119 Type 2 diabetes mellitus without complications: Secondary | ICD-10-CM | POA: Diagnosis not present

## 2024-04-25 DIAGNOSIS — E119 Type 2 diabetes mellitus without complications: Secondary | ICD-10-CM | POA: Diagnosis not present

## 2024-04-25 DIAGNOSIS — Z1211 Encounter for screening for malignant neoplasm of colon: Secondary | ICD-10-CM | POA: Diagnosis not present

## 2024-04-25 DIAGNOSIS — Z0000 Encounter for general adult medical examination without abnormal findings: Secondary | ICD-10-CM | POA: Diagnosis not present

## 2024-04-25 DIAGNOSIS — E782 Mixed hyperlipidemia: Secondary | ICD-10-CM | POA: Diagnosis not present

## 2024-04-25 DIAGNOSIS — Z1331 Encounter for screening for depression: Secondary | ICD-10-CM | POA: Diagnosis not present

## 2024-04-25 DIAGNOSIS — Z1231 Encounter for screening mammogram for malignant neoplasm of breast: Secondary | ICD-10-CM | POA: Diagnosis not present

## 2024-04-25 DIAGNOSIS — E559 Vitamin D deficiency, unspecified: Secondary | ICD-10-CM | POA: Diagnosis not present

## 2024-04-25 DIAGNOSIS — R7989 Other specified abnormal findings of blood chemistry: Secondary | ICD-10-CM | POA: Diagnosis not present

## 2024-05-23 DIAGNOSIS — R7989 Other specified abnormal findings of blood chemistry: Secondary | ICD-10-CM | POA: Diagnosis not present

## 2024-05-23 DIAGNOSIS — E039 Hypothyroidism, unspecified: Secondary | ICD-10-CM | POA: Diagnosis not present

## 2024-08-13 ENCOUNTER — Other Ambulatory Visit: Payer: Self-pay

## 2024-08-13 ENCOUNTER — Emergency Department
Admission: EM | Admit: 2024-08-13 | Discharge: 2024-08-13 | Disposition: A | Discharge status: Home / Self Care | Attending: Emergency Medicine | Admitting: Emergency Medicine

## 2024-08-13 ENCOUNTER — Emergency Department

## 2024-08-13 DIAGNOSIS — R0789 Other chest pain: Secondary | ICD-10-CM | POA: Diagnosis not present

## 2024-08-13 DIAGNOSIS — L02214 Cutaneous abscess of groin: Secondary | ICD-10-CM | POA: Diagnosis not present

## 2024-08-13 DIAGNOSIS — I517 Cardiomegaly: Secondary | ICD-10-CM | POA: Diagnosis not present

## 2024-08-13 DIAGNOSIS — Z96611 Presence of right artificial shoulder joint: Secondary | ICD-10-CM | POA: Diagnosis not present

## 2024-08-13 DIAGNOSIS — R079 Chest pain, unspecified: Secondary | ICD-10-CM | POA: Diagnosis not present

## 2024-08-13 DIAGNOSIS — L02213 Cutaneous abscess of chest wall: Secondary | ICD-10-CM | POA: Diagnosis not present

## 2024-08-13 DIAGNOSIS — R0602 Shortness of breath: Secondary | ICD-10-CM | POA: Insufficient documentation

## 2024-08-13 DIAGNOSIS — Z96612 Presence of left artificial shoulder joint: Secondary | ICD-10-CM | POA: Diagnosis not present

## 2024-08-13 LAB — D-DIMER, QUANTITATIVE: D-Dimer, Quant: 0.65 ug{FEU}/mL — ABNORMAL HIGH (ref 0.00–0.50)

## 2024-08-13 LAB — CBC WITH DIFFERENTIAL/PLATELET
Abs Immature Granulocytes: 0.02 K/uL (ref 0.00–0.07)
Basophils Absolute: 0.1 K/uL (ref 0.0–0.1)
Basophils Relative: 1 %
Eosinophils Absolute: 0.2 K/uL (ref 0.0–0.5)
Eosinophils Relative: 2 %
HCT: 38.5 % (ref 36.0–46.0)
Hemoglobin: 12.6 g/dL (ref 12.0–15.0)
Immature Granulocytes: 0 %
Lymphocytes Relative: 34 %
Lymphs Abs: 2.5 K/uL (ref 0.7–4.0)
MCH: 29.6 pg (ref 26.0–34.0)
MCHC: 32.7 g/dL (ref 30.0–36.0)
MCV: 90.4 fL (ref 80.0–100.0)
Monocytes Absolute: 0.6 K/uL (ref 0.1–1.0)
Monocytes Relative: 7 %
Neutro Abs: 4.1 K/uL (ref 1.7–7.7)
Neutrophils Relative %: 56 %
Platelets: 237 K/uL (ref 150–400)
RBC: 4.26 MIL/uL (ref 3.87–5.11)
RDW: 13 % (ref 11.5–15.5)
WBC: 7.4 K/uL (ref 4.0–10.5)
nRBC: 0 % (ref 0.0–0.2)

## 2024-08-13 LAB — COMPREHENSIVE METABOLIC PANEL WITH GFR
ALT: 14 U/L (ref 0–44)
AST: 22 U/L (ref 15–41)
Albumin: 3.4 g/dL — ABNORMAL LOW (ref 3.5–5.0)
Alkaline Phosphatase: 68 U/L (ref 38–126)
Anion gap: 8 (ref 5–15)
BUN: 17 mg/dL (ref 8–23)
CO2: 24 mmol/L (ref 22–32)
Calcium: 8.9 mg/dL (ref 8.9–10.3)
Chloride: 108 mmol/L (ref 98–111)
Creatinine, Ser: 0.81 mg/dL (ref 0.44–1.00)
GFR, Estimated: >60 mL/min (ref >60)
Glucose, Bld: 124 mg/dL — ABNORMAL HIGH (ref 70–99)
Potassium: 4.4 mmol/L (ref 3.5–5.1)
Sodium: 140 mmol/L (ref 135–145)
Total Bilirubin: 0.9 mg/dL (ref 0.0–1.2)
Total Protein: 7.1 g/dL (ref 6.5–8.1)

## 2024-08-13 LAB — LIPASE, BLOOD: Lipase: 38 U/L (ref 11–51)

## 2024-08-13 LAB — TROPONIN I (HIGH SENSITIVITY)
Troponin I (High Sensitivity): 5 ng/L (ref <18)
Troponin I (High Sensitivity): 4 ng/L (ref <18)

## 2024-08-13 MED ORDER — MORPHINE SULFATE (PF) 4 MG/ML IV SOLN
4.0000 mg | Freq: Once | INTRAVENOUS | Status: AC
  Administered 2024-08-13: 4 mg via INTRAVENOUS
  Filled 2024-08-13: qty 1

## 2024-08-13 MED ORDER — LIDOCAINE 5 % EX PTCH: 1.0000 | MEDICATED_PATCH | Freq: Two times a day (BID) | CUTANEOUS | 0 refills | Status: DC

## 2024-08-13 MED ORDER — LIDOCAINE 5 % EX PTCH: 1.0000 | MEDICATED_PATCH | CUTANEOUS | Status: DC

## 2024-08-13 MED ORDER — KETOROLAC TROMETHAMINE 30 MG/ML IJ SOLN
15.0000 mg | Freq: Once | INTRAMUSCULAR | Status: AC
  Administered 2024-08-13: 15 mg via INTRAVENOUS
  Filled 2024-08-13: qty 1

## 2024-08-13 MED ORDER — ONDANSETRON HCL 4 MG/2ML IJ SOLN
4.0000 mg | INTRAMUSCULAR | Status: AC
  Administered 2024-08-13: 4 mg via INTRAVENOUS
  Filled 2024-08-13: qty 2

## 2024-08-13 MED ORDER — LIDOCAINE 5 % EX PTCH
1.0000 | MEDICATED_PATCH | Freq: Two times a day (BID) | CUTANEOUS | 0 refills | Status: AC
Start: 2024-08-13 — End: 2025-08-13

## 2024-08-13 MED ORDER — DOXYCYCLINE HYCLATE 100 MG PO CAPS: 100.0000 mg | ORAL_CAPSULE | Freq: Two times a day (BID) | ORAL | 0 refills | Status: DC

## 2024-08-13 MED ORDER — DOXYCYCLINE HYCLATE 100 MG PO CAPS: 100.0000 mg | ORAL_CAPSULE | Freq: Two times a day (BID) | ORAL | 0 refills | Status: AC

## 2024-08-13 NOTE — Discharge Instructions (Signed)
 You have been seen in the Emergency Department (ED) today for chest pain.  As we have discussed today's test results are normal, and we believe your pain is due to pain/strain and/or inflammation of the muscles and/or cartilage of your chest wall.  We recommend you take ibuprofen 600 mg three times a day with meals for the next 5 days (unless you have been told previously not to take ibuprofen or NSAIDs in general).  You may also take Tylenol  according to the label instructions.  Read through the included information for additional treatment recommendations and precautions.  Please also take the full course of antibiotics as prescribed for the small abscess that is developing on your left groin.  There is nothing drainable at this time and hopefully if you take the antibiotics it will clear it up without needing incision and drainage.  Follow-up with your regular provider at the next available opportunity for reexamination.  Continue to take your regular medications.   Return to the Emergency Department (ED) if you experience any further chest pain/pressure/tightness, difficulty breathing, or sudden sweating, or other symptoms that concern you.

## 2024-08-13 NOTE — ED Provider Notes (Signed)
 [facility] Provider Note    Event Date/Time   First MD Initiated Contact with Patient 08/13/24 0301     (approximate)   History   Chest Pain   HPI Elaine Harmon is a 73 y.o. female who presents for evaluation of pain beneath her right breast which she describes as feeling like pleurisy.  She said that she had similar symptoms when she was in her 50s.  She describes the onset of sharp stabbing pain beneath her right breast that started about 3 days ago.  She says she woke up with it and it hurts when she takes deep breaths or moves in certain ways.  It does not seem to change very much based on her position.  The pain has gotten steadily worse over the last several days until tonight when she could not sleep as a result.  Sometimes the pain is bad enough that it makes her feel short of breath as well.    She has had no nausea or vomiting.  She has had no recent injuries or falls.  She denies fever and chills.  On an unrelated note, she reports that she has a bump on her leg that she believes is a staph infection.     Physical Exam   Triage Vital Signs: ED Triage Vitals  Encounter Vitals Group     BP 08/13/24 0305 137/75     Girls Systolic BP Percentile --      Girls Diastolic BP Percentile --      Boys Systolic BP Percentile --      Boys Diastolic BP Percentile --      Pulse Rate 08/13/24 0305 64     Resp 08/13/24 0305 20     Temp 08/13/24 0305 98 F (36.7 C)     Temp Source 08/13/24 0305 Oral     SpO2 08/13/24 0305 97 %     Weight 08/13/24 0302 85.3 kg (188 lb)     Height 08/13/24 0302 1.575 m (5' 2)     Head Circumference --      Peak Flow --      Pain Score 08/13/24 0302 8     Pain Loc --      Pain Education --      Exclude from Growth Chart --     Most recent vital signs: Vitals:   08/13/24 0305 08/13/24 0315  BP: 137/75   Pulse: 64 63  Resp: 20 16  Temp: 98 F (36.7 C)   SpO2: 97% 100%    General: Awake, alert, generally well  appearing but does appear somewhat uncomfortable. CV:  Good peripheral perfusion.  Regular rate and rhythm.  Normal heart sounds with no appreciable friction rub. Resp:  Normal effort. Speaking easily and comfortably, no accessory muscle usage nor intercostal retractions.  Lungs are clear to auscultation bilaterally.  Patient reports pain with deep inspiration. Abd:  No distention.  No tenderness to palpation in the epigastrium nor in the right upper quadrant. Other:  No peripheral edema or unilateral leg swelling.   ED Results / Procedures / Treatments   Labs (all labs ordered are listed, but only abnormal results are displayed) Labs Reviewed  D-DIMER, QUANTITATIVE - Abnormal; Notable for the following components:      Result Value   D-Dimer, Quant 0.65 (*)    All other components within normal limits  COMPREHENSIVE METABOLIC PANEL WITH GFR - Abnormal; Notable for the following components:   Glucose, Bld  124 (*)    Albumin 3.4 (*)    All other components within normal limits  LIPASE, BLOOD  CBC WITH DIFFERENTIAL/PLATELET  CBC WITH DIFFERENTIAL/PLATELET  TROPONIN I (HIGH SENSITIVITY)  TROPONIN I (HIGH SENSITIVITY)     EKG  ED ECG REPORT I, Darleene Dome, the attending physician, personally viewed and interpreted this ECG.  Date: 08/13/2024 EKG Time: 3:04 AM Rate: 67 Rhythm: Sinus rhythm with first-degree AV block QRS Axis: normal Intervals: PR interval of 223 ms ST/T Wave abnormalities: Non-specific ST segment / T-wave changes, but no clear evidence of acute ischemia. Narrative Interpretation: no definitive evidence of acute ischemia; does not meet STEMI criteria.    RADIOLOGY See ED course for details   PROCEDURES:  Critical Care performed: No  .1-3 Lead EKG Interpretation  Performed by: Dome Darleene, MD Authorized by: Dome Darleene, MD     Interpretation: normal     ECG rate:  60   ECG rate assessment: normal     Rhythm: sinus rhythm     Ectopy: none      Conduction: normal       IMPRESSION / MDM / ASSESSMENT AND PLAN / ED COURSE  I reviewed the triage vital signs and the nursing notes.                              Differential diagnosis includes, but is not limited to, musculoskeletal pain, pneumonia, pneumothorax, PE, pericarditis, pericardial effusion, ACS.  Patient's presentation is most consistent with acute presentation with potential threat to life or bodily function.  Labs/studies ordered: CMP, CBC with differential, lipase, D-dimer, high-sensitivity troponin, two-view chest x-ray, EKG  Interventions/Medications given:  Medications  lidocaine  (LIDODERM ) 5 % 1 patch (has no administration in time range)  morphine  (PF) 4 MG/ML injection 4 mg (4 mg Intravenous Given 08/13/24 0341)  ondansetron  (ZOFRAN ) injection 4 mg (4 mg Intravenous Given 08/13/24 0340)  ketorolac  (TORADOL ) 30 MG/ML injection 15 mg (15 mg Intravenous Given 08/13/24 0340)    (Note:  hospital course my include additional interventions and/or labs/studies not listed above.)   Patient's vitals are stable and within normal limits.  EKG is reassuring with no clear evidence of ischemia.  Labs pending.  I ordered morphine  4 mg IV and Zofran  4 mg IV for pain and nausea prevention.  I ordered a D-dimer, the result of which I will evaluate based on age adjustment to determine if she would benefit from a CTA chest.    The patient is on the cardiac monitor to evaluate for evidence of arrhythmia and/or significant heart rate changes.   Clinical Course as of 08/13/24 9361  [place] Aug 13, 2024  0508 D-Dimer, Quant(!): 0.65 Although the D-dimer is technically elevated, the age-adjusted D-dimer is within normal limits which is reassuring.  There is no indication to proceed with a CTA chest based on the low probability of PE. [CF]  9491 DG Chest 2 View I independently viewed and interpreted the patient's chest x-ray(s), and I also reviewed the radiologist's report.  No evidence of  pneumonia nor pneumothorax. [CF]  [id number] Patient still has highly reproducible pain with movement and palpation of the right chest wall.  This is consistent with musculoskeletal pain.  Her second troponin is negative.  She is appropriate for discharge and outpatient management of musculoskeletal chest wall pain with medications as listed above and below.  I recommended that she take ibuprofen 600 mg 3 times a day  with meals as well as using the Lidoderm  patches.  She also reminded me about the swelling and painful areas she has on her groin and said that she repeatedly gets staph infections in that area.  With ED nurse as chaperone, I assessed and found that she has a very small indurated area on her left groin that is slightly erythematous but has no drainable fluid collection.  She would like a prescription for antibiotics and I think that is reasonable.  Doxycycline  as listed below and I recommended close follow-up. [CF]  [phone]   The patient's medical screening exam is reassuring with no indication of an emergent medical condition requiring hospitalization or additional evaluation at this point.  The patient is safe and appropriate for discharge and outpatient follow up. [CF]    Clinical Course User Index [CF] Gordan Huxley, MD     FINAL CLINICAL IMPRESSION(S) / ED DIAGNOSES   Final diagnoses:  Right-sided chest wall pain  Cutaneous abscess of groin     Rx / DC Orders   ED Discharge Orders          Ordered    doxycycline  (VIBRAMYCIN ) 100 MG capsule  2 times daily,   Status:  Discontinued        08/13/24 0634    lidocaine  (LIDODERM ) 5 %  Every 12 hours,   Status:  Discontinued        08/13/24 0634    doxycycline  (VIBRAMYCIN ) 100 MG capsule  2 times daily        08/13/24 0635    lidocaine  (LIDODERM ) 5 %  Every 12 hours        08/13/24 9364             Note:  This document was prepared using Dragon voice recognition software and may include unintentional dictation errors.    Gordan Huxley, MD 08/13/24 [phone]

## 2024-08-13 NOTE — ED Notes (Signed)
 PT in no acute distress prior to discharge. Discharged instructions reviewed and pt stated that they understand directions. Pt has all belongings with them at time of discharge.

## 2024-08-13 NOTE — ED Triage Notes (Incomplete)
 Pt comes in from home tonight with c/o right sided chest pain radiating to her back and up to her shoulder that began Thursday. Pt has not taken medication for pain. Denies N/V

## 2024-10-19 DIAGNOSIS — E039 Hypothyroidism, unspecified: Secondary | ICD-10-CM | POA: Diagnosis not present

## 2024-10-19 DIAGNOSIS — E119 Type 2 diabetes mellitus without complications: Secondary | ICD-10-CM | POA: Diagnosis not present

## 2024-10-19 DIAGNOSIS — E559 Vitamin D deficiency, unspecified: Secondary | ICD-10-CM | POA: Diagnosis not present

## 2024-10-19 DIAGNOSIS — E782 Mixed hyperlipidemia: Secondary | ICD-10-CM | POA: Diagnosis not present

## 2024-10-26 DIAGNOSIS — E782 Mixed hyperlipidemia: Secondary | ICD-10-CM | POA: Diagnosis not present

## 2024-10-26 DIAGNOSIS — E119 Type 2 diabetes mellitus without complications: Secondary | ICD-10-CM | POA: Diagnosis not present

## 2024-10-26 DIAGNOSIS — E559 Vitamin D deficiency, unspecified: Secondary | ICD-10-CM | POA: Diagnosis not present

## 2024-10-26 DIAGNOSIS — E039 Hypothyroidism, unspecified: Secondary | ICD-10-CM | POA: Diagnosis not present
